# Patient Record
Sex: Female | Born: 1990 | Race: White | Hispanic: No | Marital: Single | Smoking: Never smoker
Health system: Southern US, Community
[De-identification: ages and names within clinical notes are randomized; demographics above are authoritative.]

## PROBLEM LIST (undated history)

## (undated) DIAGNOSIS — D649 Anemia, unspecified: Secondary | ICD-10-CM

## (undated) DIAGNOSIS — B1920 Unspecified viral hepatitis C without hepatic coma: Secondary | ICD-10-CM

## (undated) DIAGNOSIS — K589 Irritable bowel syndrome without diarrhea: Secondary | ICD-10-CM

## (undated) DIAGNOSIS — N390 Urinary tract infection, site not specified: Secondary | ICD-10-CM

## (undated) DIAGNOSIS — R87629 Unspecified abnormal cytological findings in specimens from vagina: Secondary | ICD-10-CM

## (undated) DIAGNOSIS — B9689 Other specified bacterial agents as the cause of diseases classified elsewhere: Secondary | ICD-10-CM

## (undated) DIAGNOSIS — N760 Acute vaginitis: Secondary | ICD-10-CM

## (undated) DIAGNOSIS — IMO0002 Reserved for concepts with insufficient information to code with codable children: Secondary | ICD-10-CM

## (undated) DIAGNOSIS — Z8742 Personal history of other diseases of the female genital tract: Secondary | ICD-10-CM

## (undated) DIAGNOSIS — R87619 Unspecified abnormal cytological findings in specimens from cervix uteri: Secondary | ICD-10-CM

## (undated) HISTORY — DX: Other specified bacterial agents as the cause of diseases classified elsewhere: B96.89

## (undated) HISTORY — DX: Other specified bacterial agents as the cause of diseases classified elsewhere: N76.0

## (undated) HISTORY — PX: NO PAST SURGERIES: SHX2092

## (undated) HISTORY — DX: Unspecified viral hepatitis C without hepatic coma: B19.20

## (undated) HISTORY — DX: Unspecified abnormal cytological findings in specimens from vagina: R87.629

## (undated) HISTORY — DX: Irritable bowel syndrome, unspecified: K58.9

## (undated) HISTORY — DX: Personal history of other diseases of the female genital tract: Z87.42

---

## 2011-11-03 ENCOUNTER — Encounter (HOSPITAL_COMMUNITY): Payer: Self-pay | Admitting: *Deleted

## 2011-11-03 ENCOUNTER — Inpatient Hospital Stay (HOSPITAL_COMMUNITY)
Admission: AD | Admit: 2011-11-03 | Discharge: 2011-11-03 | Disposition: A | Discharge status: Hospice | Payer: Self-pay | Source: Ambulatory Visit | Attending: Obstetrics & Gynecology | Admitting: Obstetrics & Gynecology

## 2011-11-03 DIAGNOSIS — R300 Dysuria: Secondary | ICD-10-CM | POA: Insufficient documentation

## 2011-11-03 DIAGNOSIS — N390 Urinary tract infection, site not specified: Secondary | ICD-10-CM

## 2011-11-03 HISTORY — DX: Urinary tract infection, site not specified: N39.0

## 2011-11-03 HISTORY — DX: Unspecified abnormal cytological findings in specimens from cervix uteri: R87.619

## 2011-11-03 HISTORY — DX: Anemia, unspecified: D64.9

## 2011-11-03 HISTORY — DX: Reserved for concepts with insufficient information to code with codable children: IMO0002

## 2011-11-03 LAB — URINE MICROSCOPIC-ADD ON

## 2011-11-03 LAB — URINALYSIS, ROUTINE W REFLEX MICROSCOPIC
Glucose, UA: NEGATIVE mg/dL
Ketones, ur: NEGATIVE mg/dL
pH: 6.5 (ref 5.0–8.0)

## 2011-11-03 MED ORDER — SULFAMETHOXAZOLE-TRIMETHOPRIM 800-160 MG PO TABS: 1.0000 | ORAL_TABLET | Freq: Two times a day (BID) | ORAL | Status: AC

## 2011-11-03 NOTE — MAU Provider Note (Signed)
History     CSN: [id number]  Arrival date and time: 11/03/11 1503   First Provider Initiated Contact with Patient 11/03/11 1540      No chief complaint on file.  HPI Sheila Merritt is 21 y.o. G0P0 Unknown weeks presenting with sxs of "a bad UTI, it hurts everytime I pee and it throbs".  Has seen blood in her urine.  Has urgency. These sxs began early this am.  LMP 10/28/11.  Sexually active with 1 partner X 4 months.  Has had negative cultures since being with this partner.      Past Medical History  Diagnosis Date  . UTI (lower urinary tract infection)   . Abnormal Pap smear   . Anemia     Past Surgical History  Procedure Date  . No past surgeries     Family History  Problem Relation Age of Onset  . Kidney disease Mother   . Diabetes Mother   . Heart disease Father   . Heart disease Maternal Grandfather   . Heart disease Paternal Grandfather     History  Substance Use Topics  . Smoking status: Never Smoker   . Smokeless tobacco: Not on file  . Alcohol Use: No    Allergies: No Known Allergies  Prescriptions prior to admission  Medication Sig Dispense Refill  . PRESCRIPTION MEDICATION Take 1 tablet by mouth daily. Birth control pills        Review of Systems  Constitutional: Negative for fever and chills.  Gastrointestinal: Negative for nausea, vomiting and abdominal pain.  Genitourinary: Positive for dysuria, urgency, frequency and hematuria.       Suprapubic tenderness  Neurological: Negative for headaches.   Physical Exam   Blood pressure 109/62, pulse 78, temperature 97.5 F (36.4 C), temperature source Oral, resp. rate 16, height 5' (1.524 m), weight 53.978 kg (119 lb), last menstrual period 10/28/2011.  Physical Exam  Constitutional: She is oriented to person, place, and time. She appears well-developed and well-nourished.  HENT:  Head: Normocephalic.  Cardiovascular: Normal rate.   Respiratory: Effort normal.  Genitourinary:       Negative  for flank pain  bilaterally  Neurological: She is alert and oriented to person, place, and time.  Skin: Skin is warm and dry.  Psychiatric: She has a normal mood and affect. Her behavior is normal.   Results for orders placed during the hospital encounter of 11/03/11 (from the past 24 hour(s))  URINALYSIS, ROUTINE W REFLEX MICROSCOPIC     Status: Abnormal   Collection Time   11/03/11  3:10 PM      Component Value Range   Color, Urine STRAW (*) YELLOW   APPearance CLEAR  CLEAR   Specific Gravity, Urine <1.005 (*) 1.005 - 1.030   pH 6.5  5.0 - 8.0   Glucose, UA NEGATIVE  NEGATIVE mg/dL   Hgb urine dipstick MODERATE (*) NEGATIVE   Bilirubin Urine NEGATIVE  NEGATIVE   Ketones, ur NEGATIVE  NEGATIVE mg/dL   Protein, ur NEGATIVE  NEGATIVE mg/dL   Urobilinogen, UA 0.2  0.0 - 1.0 mg/dL   Nitrite NEGATIVE  NEGATIVE   Leukocytes, UA SMALL (*) NEGATIVE  URINE MICROSCOPIC-ADD ON     Status: Normal   Collection Time   11/03/11  3:10 PM      Component Value Range   Squamous Epithelial / LPF RARE  RARE   WBC, UA 0-2  <3 WBC/hpf   RBC / HPF 11-20  <3 RBC/hpf  POCT PREGNANCY, URINE  Status: Normal   Collection Time   11/03/11  3:13 PM      Component Value Range   Preg Test, Ur NEGATIVE  NEGATIVE   MAU Course  Procedures  MDM   Assessment and Plan  A:  UTI  P:  Rx for Bactrim DS bid for 3 days     Increase po fluids     Instructed to urinate after intercourse  Bryttany Tortorelli,EVE M 11/03/2011, 3:40 PM

## 2011-11-03 NOTE — MAU Note (Signed)
Pt started with UTI symptoms early this morning.  She is having throbbing in her bladder, frequency, light bleeding and bladder spasms after voids

## 2011-11-04 NOTE — MAU Provider Note (Signed)
Attestation of Attending Supervision of Advanced Practitioner (CNM/NP): Evaluation and management procedures were performed by the Advanced Practitioner under my supervision and collaboration.  I have reviewed the Advanced Practitioner's note and chart, and I agree with the management and plan.  Jaynie Collins, M.D. 11/04/2011 8:05 AM

## 2014-12-08 ENCOUNTER — Encounter: Payer: Self-pay | Admitting: Adult Health

## 2014-12-08 ENCOUNTER — Other Ambulatory Visit (HOSPITAL_COMMUNITY)
Admission: RE | Admit: 2014-12-08 | Discharge: 2014-12-08 | Disposition: A | Discharge status: Home / Self Care | Payer: Self-pay | Source: Ambulatory Visit | Attending: Adult Health | Admitting: Adult Health

## 2014-12-08 ENCOUNTER — Ambulatory Visit (INDEPENDENT_AMBULATORY_CARE_PROVIDER_SITE_OTHER): Payer: 59 | Admitting: Adult Health

## 2014-12-08 VITALS — BP 90/68 | HR 72 | Ht 61.0 in | Wt 105.0 lb

## 2014-12-08 DIAGNOSIS — Z01411 Encounter for gynecological examination (general) (routine) with abnormal findings: Secondary | ICD-10-CM | POA: Insufficient documentation

## 2014-12-08 DIAGNOSIS — Z3202 Encounter for pregnancy test, result negative: Secondary | ICD-10-CM

## 2014-12-08 DIAGNOSIS — Z1389 Encounter for screening for other disorder: Secondary | ICD-10-CM | POA: Diagnosis not present

## 2014-12-08 DIAGNOSIS — Z30011 Encounter for initial prescription of contraceptive pills: Secondary | ICD-10-CM

## 2014-12-08 DIAGNOSIS — Z8742 Personal history of other diseases of the female genital tract: Secondary | ICD-10-CM

## 2014-12-08 DIAGNOSIS — Z01419 Encounter for gynecological examination (general) (routine) without abnormal findings: Secondary | ICD-10-CM | POA: Diagnosis not present

## 2014-12-08 DIAGNOSIS — Z113 Encounter for screening for infections with a predominantly sexual mode of transmission: Secondary | ICD-10-CM | POA: Insufficient documentation

## 2014-12-08 DIAGNOSIS — R208 Other disturbances of skin sensation: Secondary | ICD-10-CM

## 2014-12-08 HISTORY — DX: Personal history of other diseases of the female genital tract: Z87.42

## 2014-12-08 LAB — POCT URINALYSIS DIPSTICK
Blood, UA: NEGATIVE
GLUCOSE UA: NEGATIVE
Leukocytes, UA: NEGATIVE
NITRITE UA: NEGATIVE
Protein, UA: NEGATIVE

## 2014-12-08 LAB — POCT URINE PREGNANCY: Preg Test, Ur: NEGATIVE

## 2014-12-08 MED ORDER — NORETHINDRONE-ETH ESTRADIOL 0.5-35 MG-MCG PO TABS: 1.0000 | ORAL_TABLET | Freq: Every day | ORAL | Status: DC

## 2014-12-08 NOTE — Patient Instructions (Signed)
Physical in 1 year Take OCs daily at same time Use condoms

## 2014-12-08 NOTE — Progress Notes (Signed)
Patient ID: Sheila Merritt, female   DOB: [birth year], 24 y.o.   MRN: [id number] History of Present Illness: Sheila Merritt is a 24 year old white female, single, new to this practice,in for well woman gyn exam and pap, and needs OCs refilled, has been off 3-4 weeks now.Has history of abnormal pap.Has some burning with urination at times.She has been on Brevicon for 8 years and missed appt, and could not get refills, at prior health center in [place].She works second shift and needs morning appts.   Current Medications, Allergies, Past Medical History, Past Surgical History, Family History and Social History were reviewed in [employer] record.     Review of Systems: Patient denies any headaches, hearing loss, fatigue, blurred vision, shortness of breath, chest pain, abdominal pain, problems with bowel movements, or intercourse. No joint pain or mood swings. See HPI for positives.    Physical Exam:BP 90/68 mmHg  Pulse 72  Ht  (1.549 m)  Wt 105 lb (47.628 kg)  BMI 19.85 kg/m2  LMP 12/03/2014 UPT negative, urine dip stick negative General:  Well developed, well nourished, no acute distress Skin:  Warm and dry, tan has tattoos Neck:  Midline trachea, normal thyroid, good ROM, no lymphadenopathy Lungs; Clear to auscultation bilaterally Breast:  No dominant palpable mass, retraction, or nipple discharge Cardiovascular: Regular rate and rhythm Abdomen:  Soft, non tender, no hepatosplenomegaly Pelvic:  External genitalia is normal in appearance, no lesions.  The vagina is normal in appearance. Urethra has no lesions or masses. The cervix is smooth and tiny, pap with GC/CHl performed.  Uterus is felt to be normal size, shape, and contour.  No adnexal masses or tenderness noted.Bladder is non tender, no masses felt. Extremities/musculoskeletal:  No swelling or varicosities noted, no clubbing or cyanosis Psych:  No mood changes, alert and cooperative,seems  happy   Impression: Well woman gyn exam with pap Contraceptive management History of abnormal pap Burning with urination    Plan: Rx Brevicon disp 1 pack take 1 daily with 11 refills Physical in 1 year Use condoms

## 2014-12-12 LAB — CYTOLOGY - PAP

## 2014-12-13 ENCOUNTER — Telehealth: Payer: Self-pay | Admitting: Adult Health

## 2014-12-13 NOTE — Telephone Encounter (Signed)
Pt aware pap abnormal, needs colpo, will make appt

## 2014-12-22 ENCOUNTER — Encounter: Payer: Self-pay | Admitting: Obstetrics & Gynecology

## 2014-12-22 ENCOUNTER — Other Ambulatory Visit (HOSPITAL_COMMUNITY)
Admission: RE | Admit: 2014-12-22 | Discharge: 2014-12-22 | Disposition: A | Discharge status: Home / Self Care | Payer: 59 | Source: Ambulatory Visit | Attending: Obstetrics & Gynecology | Admitting: Obstetrics & Gynecology

## 2014-12-22 ENCOUNTER — Ambulatory Visit (INDEPENDENT_AMBULATORY_CARE_PROVIDER_SITE_OTHER): Payer: 59 | Admitting: Obstetrics & Gynecology

## 2014-12-22 VITALS — BP 100/70 | HR 72 | Wt 105.0 lb

## 2014-12-22 DIAGNOSIS — R87619 Unspecified abnormal cytological findings in specimens from cervix uteri: Secondary | ICD-10-CM | POA: Diagnosis present

## 2014-12-22 DIAGNOSIS — N871 Moderate cervical dysplasia: Secondary | ICD-10-CM

## 2014-12-22 DIAGNOSIS — IMO0002 Reserved for concepts with insufficient information to code with codable children: Secondary | ICD-10-CM

## 2014-12-22 NOTE — Progress Notes (Signed)
Patient ID: [name], female   DOB: 1991-03-06, 24 y.o.   MRN: [id number] Colposcopy Procedure Note:  Colposcopy Procedure Note  Indications: Pap smear 1 months ago showed: low-grade squamous intraepithelial neoplasia (LGSIL - encompassing HPV,mild dysplasia,CIN I). The prior pap showed ASCUS with POSITIVE high risk HPV.  Prior cervical/vaginal disease: . Prior cervical treatment: no treatment.  Smoker:  No. New sexual partner:  No.  : time frame:    History of abnormal Pap: yes  Procedure Details  The risks and benefits of the procedure and Written informed consent obtained.  Speculum placed in vagina and excellent visualization of cervix achieved, cervix swabbed x 3 with acetic acid solution.  Findings: Cervix: visible lesion(s) at 9 o'clock, acetowhite lesion(s) noted at 9 o'clock, punctation noted at 9 o'clock and mosaicism noted at 9 o'clock; SCJ visualized - lesion at 9 o'clock. Vaginal inspection: normal without visible lesions. Vulvar colposcopy: vulvar colposcopy not performed.  Specimens: cx bx  Complications: none.  Plan: Specimens labelled and sent to Pathology. Follow up 1 week

## 2014-12-22 NOTE — Addendum Note (Signed)
Addended by: Federico Flake A on: 12/22/2014 12:12 PM   Modules accepted: Orders

## 2014-12-23 ENCOUNTER — Telehealth: Payer: Self-pay | Admitting: *Deleted

## 2014-12-23 NOTE — Telephone Encounter (Signed)
Pt called stating she had a procedure yesterday and is having dry discharge. Pt was wondering if this was normal and how long it would last. I advised it sounds normal and the discharge can last a few days. Pt voiced understanding. JSY

## 2014-12-29 ENCOUNTER — Telehealth: Payer: Self-pay | Admitting: Obstetrics & Gynecology

## 2014-12-29 ENCOUNTER — Ambulatory Visit: Payer: 59 | Admitting: Obstetrics & Gynecology

## 2014-12-29 NOTE — Telephone Encounter (Signed)
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

## 2014-12-29 NOTE — Telephone Encounter (Signed)
Pt informed message sent to Dr.Eure and will notify pt as soon as Dr.Eure responds to message. Pt verbalized understanding.

## 2014-12-29 NOTE — Telephone Encounter (Signed)
Pt was unable to make her appt today, requesting results by phone.

## 2014-12-29 NOTE — Telephone Encounter (Signed)
Pt informed of result from cervical biopsy from 12/22/2014 HSIL, Dr. Despina Hidden recommendation to make an appt to discuss laser of cervix/surgical management. All questions answered in regards to abnormal results and appt made for 01/06/2015 at 10 am with Dr. Despina Hidden.

## 2015-01-06 ENCOUNTER — Encounter: Payer: Self-pay | Admitting: Obstetrics & Gynecology

## 2015-01-06 ENCOUNTER — Ambulatory Visit (INDEPENDENT_AMBULATORY_CARE_PROVIDER_SITE_OTHER): Payer: 59 | Admitting: Obstetrics & Gynecology

## 2015-01-06 ENCOUNTER — Ambulatory Visit: Payer: Self-pay | Admitting: Obstetrics & Gynecology

## 2015-01-06 VITALS — BP 120/62 | HR 60 | Ht 61.0 in | Wt 107.0 lb

## 2015-01-06 DIAGNOSIS — R87613 High grade squamous intraepithelial lesion on cytologic smear of cervix (HGSIL): Secondary | ICD-10-CM

## 2015-01-20 NOTE — Patient Instructions (Signed)
Larinda Herter  01/20/2015     @   Your procedure is scheduled on  01/27/2015   Report to [facility] at  1100  A.M.  Call this number if you have problems the morning of surgery:  [phone]   Remember:  Do not eat food or drink liquids after midnight.  Take these medicines the morning of surgery with A SIP OF WATER  none   Do not wear jewelry, make-up or nail polish.  Do not wear lotions, powders, or perfumes.  You may wear deodorant.  Do not shave 48 hours prior to surgery.  Men may shave face and neck.  Do not bring valuables to the hospital.  [facility] is not responsible for any belongings or valuables.  Contacts, dentures or bridgework may not be worn into surgery.  Leave your suitcase in the car.  After surgery it may be brought to your room.  For patients admitted to the hospital, discharge time will be determined by your treatment team.  Patients discharged the day of surgery will not be allowed to drive home.   Name and phone number of your driver:   family Special instructions:  none  Please read over the following fact sheets that you were given. Pain Booklet, Coughing and Deep Breathing, MRSA Information, Surgical Site Infection Prevention, Anesthesia Post-op Instructions and Care and Recovery After Surgery      Conization of the Cervix Cervical conization is the cutting (excision) of a cone-shaped portion of the cervix. The procedure is performed through the vagina in either your health care provider's office or an operating room. This procedure is usually done when there is abnormal bleeding from the cervix. It can also be done to evaluate an abnormal Pap test or if an abnormality is seen on the cervix during an exam. The tissue is then examined to see if there are precancerous cells or cancer present.  Conization of the cervix is not done during a menstrual period or pregnancy.  LET [facility] CARE PROVIDER KNOW ABOUT:  Any  allergies you have.   All medicines you are taking, including vitamins, herbs, eye drops, creams, and over-the-counter medicines.   Previous problems you or members of your family have had with the use of anesthetics.   Any blood disorders you have.   Previous surgeries you have had.   Medical conditions you have.   Your smoking habits.   The possibility of being pregnant.  RISKS AND COMPLICATIONS  Generally, conization of the cervix is a safe procedure. However, as with any procedure, complications can occur. Possible complications include:  Heavy bleeding several days or weeks after the procedure. Light bleeding or spotting after the procedure is normal.  Infection (rare).  Damage to the cervix or surrounding organs (uncommon).   Problems with the anesthesia.   Increased risk of preterm labor in future pregnancies. BEFORE THE PROCEDURE  Do not eat or drink anything for 6-8 hours before the procedure.   Do not take aspirin or blood thinners for at least a week before the procedure or as directed by your health care provider.   Arrange for someone to take you home after the procedure.  PROCEDURE There are three different methods to perform conization of the cervix. These include:   The cold knife method. In this method a small cone-shaped sample of tissue is cut out with a knife (scalpel) from the cervical canal and the transformation zone (where the normal cells  end and the abnormal cells begin).   The LEEP method. In this method a small cone-shaped sample of tissue is cut out with a thin wire that can burn (cauterize) the cervical tissue with an electrical current.   Laser treatment. In this method a small cone-shaped sample of tissue is cut out and then cauterized with a laser beam to prevent bleeding.  The procedure will be performed as follows:   Depending on the method, you will either be given a medicine to make you sleep (general anesthetic) or a  numbing medicine (local anesthetic). A medicine that numbs the cervix (cervical block) may be given.   A lubricated device called a speculum will be inserted into the vagina to spread open the walls of the vagina. This will help your health care provider see the inside of the vagina and cervix better.   The tissue from the cervix will be removed and examined.   The results of the procedure will help your health care provider decide if further treatment is necessary. They will also help your health care provider decide on the best treatment if your results are abnormal. AFTER THE PROCEDURE  If you had a general anesthetic, you may be groggy for 2-3 hours after the procedure.   If you had a local anesthetic, you will rest at the clinic or hospital until you are stable and feel ready to go home.   Recovery may take up to 3 weeks.   You may have some cramping for about 1 week.   You may have bloody discharge or light bleeding for 1-2 weeks.   You may have black discharge coming from the vagina. This is from the paste used on the cervix to prevent bleeding. This is normal discharge.  Document Released: 01/23/2005 Document Revised: 04/20/2013 Document Reviewed: 10/09/2012 [facility] Patient Information 2015 [place], [place]. This information is not intended to replace advice given to you by your health care provider. Make sure you discuss any questions you have with your health care provider. PATIENT INSTRUCTIONS POST-ANESTHESIA  IMMEDIATELY FOLLOWING SURGERY:  Do not drive or operate machinery for the first twenty four hours after surgery.  Do not make any important decisions for twenty four hours after surgery or while taking narcotic pain medications or sedatives.  If you develop intractable nausea and vomiting or a severe headache please notify your doctor immediately.  FOLLOW-UP:  Please make an appointment with your surgeon as instructed. You do not need to follow up with anesthesia  unless specifically instructed to do so.  WOUND CARE INSTRUCTIONS (if applicable):  Keep a dry clean dressing on the anesthesia/puncture wound site if there is drainage.  Once the wound has quit draining you may leave it open to air.  Generally you should leave the bandage intact for twenty four hours unless there is drainage.  If the epidural site drains for more than 36-48 hours please call the anesthesia department.  QUESTIONS?:  Please feel free to call your physician or the hospital operator if you have any questions, and they will be happy to assist you.

## 2015-01-23 ENCOUNTER — Encounter (HOSPITAL_COMMUNITY)
Admission: RE | Admit: 2015-01-23 | Discharge: 2015-01-23 | Disposition: A | Discharge status: Home / Self Care | Payer: Managed Care, Other (non HMO) | Source: Ambulatory Visit | Attending: Obstetrics & Gynecology | Admitting: Obstetrics & Gynecology

## 2015-01-25 ENCOUNTER — Encounter (HOSPITAL_COMMUNITY)
Admission: RE | Admit: 2015-01-25 | Discharge: 2015-01-25 | Disposition: A | Discharge status: Home / Self Care | Payer: 59 | Source: Ambulatory Visit | Attending: Obstetrics & Gynecology | Admitting: Obstetrics & Gynecology

## 2015-01-25 ENCOUNTER — Encounter (HOSPITAL_COMMUNITY): Payer: Self-pay

## 2015-01-25 DIAGNOSIS — R87613 High grade squamous intraepithelial lesion on cytologic smear of cervix (HGSIL): Secondary | ICD-10-CM | POA: Diagnosis present

## 2015-01-25 DIAGNOSIS — Z01812 Encounter for preprocedural laboratory examination: Secondary | ICD-10-CM | POA: Diagnosis not present

## 2015-01-25 DIAGNOSIS — D067 Carcinoma in situ of other parts of cervix: Secondary | ICD-10-CM | POA: Diagnosis not present

## 2015-01-25 LAB — URINALYSIS, ROUTINE W REFLEX MICROSCOPIC
Bilirubin Urine: NEGATIVE
Glucose, UA: NEGATIVE mg/dL
Ketones, ur: NEGATIVE mg/dL
LEUKOCYTES UA: NEGATIVE
Nitrite: NEGATIVE
Protein, ur: NEGATIVE mg/dL
UROBILINOGEN UA: 0.2 mg/dL (ref 0.0–1.0)
pH: 5.5 (ref 5.0–8.0)

## 2015-01-25 LAB — BASIC METABOLIC PANEL
Anion gap: 7 (ref 5–15)
BUN: 12 mg/dL (ref 6–20)
CHLORIDE: 103 mmol/L (ref 101–111)
CO2: 26 mmol/L (ref 22–32)
Calcium: 8.6 mg/dL — ABNORMAL LOW (ref 8.9–10.3)
Creatinine, Ser: 0.82 mg/dL (ref 0.44–1.00)
GFR calc Af Amer: >60 mL/min (ref >60)
GFR calc non Af Amer: >60 mL/min (ref >60)
GLUCOSE: 135 mg/dL — AB (ref 65–99)
POTASSIUM: 3.7 mmol/L (ref 3.5–5.1)
Sodium: 136 mmol/L (ref 135–145)

## 2015-01-25 LAB — CBC
HEMATOCRIT: 36.8 % (ref 36.0–46.0)
HEMOGLOBIN: 12.4 g/dL (ref 12.0–15.0)
MCH: 30.8 pg (ref 26.0–34.0)
MCHC: 33.7 g/dL (ref 30.0–36.0)
MCV: 91.5 fL (ref 78.0–100.0)
Platelets: 220 10*3/uL (ref 150–400)
RBC: 4.02 MIL/uL (ref 3.87–5.11)
RDW: 13.3 % (ref 11.5–15.5)
WBC: 4.2 10*3/uL (ref 4.0–10.5)

## 2015-01-25 LAB — URINE MICROSCOPIC-ADD ON

## 2015-01-25 LAB — HCG, SERUM, QUALITATIVE: Preg, Serum: NEGATIVE

## 2015-01-27 ENCOUNTER — Ambulatory Visit (HOSPITAL_COMMUNITY): Payer: 59 | Admitting: Anesthesiology

## 2015-01-27 ENCOUNTER — Encounter (HOSPITAL_COMMUNITY)
Admission: RE | Disposition: A | Discharge status: Home / Self Care | Payer: Self-pay | Source: Ambulatory Visit | Attending: Obstetrics & Gynecology

## 2015-01-27 ENCOUNTER — Encounter (HOSPITAL_COMMUNITY): Payer: Self-pay | Admitting: Anesthesiology

## 2015-01-27 ENCOUNTER — Ambulatory Visit (HOSPITAL_COMMUNITY)
Admission: RE | Admit: 2015-01-27 | Discharge: 2015-01-27 | Disposition: A | Discharge status: Home / Self Care | Payer: 59 | Source: Ambulatory Visit | Attending: Obstetrics & Gynecology | Admitting: Obstetrics & Gynecology

## 2015-01-27 DIAGNOSIS — D067 Carcinoma in situ of other parts of cervix: Secondary | ICD-10-CM | POA: Diagnosis not present

## 2015-01-27 DIAGNOSIS — R87613 High grade squamous intraepithelial lesion on cytologic smear of cervix (HGSIL): Secondary | ICD-10-CM | POA: Diagnosis not present

## 2015-01-27 DIAGNOSIS — Z01812 Encounter for preprocedural laboratory examination: Secondary | ICD-10-CM | POA: Insufficient documentation

## 2015-01-27 DIAGNOSIS — N871 Moderate cervical dysplasia: Secondary | ICD-10-CM | POA: Diagnosis not present

## 2015-01-27 HISTORY — PX: CERVICAL CONIZATION W/BX: SHX1330

## 2015-01-27 HISTORY — PX: HOLMIUM LASER APPLICATION: SHX5852

## 2015-01-27 SURGERY — CONE BIOPSY, CERVIX
Anesthesia: General | Site: Cervix

## 2015-01-27 MED ORDER — LACTATED RINGERS IV SOLN
INTRAVENOUS | Status: DC
  Administered 2015-01-27: 500 mL via INTRAVENOUS
  Administered 2015-01-27: 12:00:00 via INTRAVENOUS

## 2015-01-27 MED ORDER — KETOROLAC TROMETHAMINE 30 MG/ML IJ SOLN
INTRAMUSCULAR | Status: AC
  Filled 2015-01-27: qty 1

## 2015-01-27 MED ORDER — FENTANYL CITRATE (PF) 100 MCG/2ML IJ SOLN
INTRAMUSCULAR | Status: AC
  Filled 2015-01-27: qty 4

## 2015-01-27 MED ORDER — PROPOFOL 10 MG/ML IV BOLUS
INTRAVENOUS | Status: DC | PRN
  Administered 2015-01-27: 50 mg via INTRAVENOUS
  Administered 2015-01-27: 100 mg via INTRAVENOUS

## 2015-01-27 MED ORDER — ONDANSETRON HCL 4 MG/2ML IJ SOLN: 4.0000 mg | Freq: Once | INTRAMUSCULAR | Status: DC | PRN

## 2015-01-27 MED ORDER — MIDAZOLAM HCL 2 MG/2ML IJ SOLN
1.0000 mg | INTRAMUSCULAR | Status: DC | PRN
  Administered 2015-01-27: 2 mg via INTRAVENOUS

## 2015-01-27 MED ORDER — FERRIC SUBSULFATE SOLN
Status: DC | PRN
  Administered 2015-01-27: 1

## 2015-01-27 MED ORDER — FENTANYL CITRATE (PF) 100 MCG/2ML IJ SOLN
INTRAMUSCULAR | Status: DC | PRN
  Administered 2015-01-27: 25 ug via INTRAVENOUS
  Administered 2015-01-27: 50 ug via INTRAVENOUS
  Administered 2015-01-27: 25 ug via INTRAVENOUS

## 2015-01-27 MED ORDER — FERRIC SUBSULFATE 259 MG/GM EX SOLN
CUTANEOUS | Status: AC
  Filled 2015-01-27: qty 8

## 2015-01-27 MED ORDER — ACETIC ACID 5 % SOLN
Status: DC | PRN
  Administered 2015-01-27: 1 via TOPICAL

## 2015-01-27 MED ORDER — PROPOFOL 10 MG/ML IV BOLUS
INTRAVENOUS | Status: AC
  Filled 2015-01-27: qty 20

## 2015-01-27 MED ORDER — MIDAZOLAM HCL 2 MG/2ML IJ SOLN
INTRAMUSCULAR | Status: AC
  Filled 2015-01-27: qty 2

## 2015-01-27 MED ORDER — KETOROLAC TROMETHAMINE 30 MG/ML IJ SOLN
INTRAMUSCULAR | Status: DC | PRN
  Administered 2015-01-27: 30 mg via INTRAVENOUS

## 2015-01-27 MED ORDER — KETOROLAC TROMETHAMINE 10 MG PO TABS: 10.0000 mg | ORAL_TABLET | Freq: Three times a day (TID) | ORAL | Status: DC | PRN

## 2015-01-27 MED ORDER — ONDANSETRON HCL 8 MG PO TABS: 8.0000 mg | ORAL_TABLET | Freq: Three times a day (TID) | ORAL | Status: DC | PRN

## 2015-01-27 MED ORDER — LIDOCAINE HCL (CARDIAC) 20 MG/ML IV SOLN
INTRAVENOUS | Status: DC | PRN
  Administered 2015-01-27: 30 mg via INTRAVENOUS

## 2015-01-27 MED ORDER — WATER FOR IRRIGATION, STERILE IR SOLN
Status: DC | PRN
  Administered 2015-01-27: 1000 mL

## 2015-01-27 MED ORDER — FENTANYL CITRATE (PF) 100 MCG/2ML IJ SOLN
25.0000 ug | INTRAMUSCULAR | Status: DC | PRN
  Administered 2015-01-27 (×2): 50 ug via INTRAVENOUS

## 2015-01-27 MED ORDER — FENTANYL CITRATE (PF) 100 MCG/2ML IJ SOLN
INTRAMUSCULAR | Status: AC
  Filled 2015-01-27: qty 2

## 2015-01-27 MED ORDER — HYDROCODONE-ACETAMINOPHEN 5-325 MG PO TABS: 1.0000 | ORAL_TABLET | Freq: Four times a day (QID) | ORAL | Status: DC | PRN

## 2015-01-27 MED ORDER — LIDOCAINE HCL (PF) 1 % IJ SOLN
INTRAMUSCULAR | Status: AC
  Filled 2015-01-27: qty 5

## 2015-01-27 SURGICAL SUPPLY — 28 items
APPLICATOR COTTON TIP 6IN STRL (MISCELLANEOUS) ×3 IMPLANT
CLOTH BEACON ORANGE TIMEOUT ST (SAFETY) ×3 IMPLANT
COAGULATOR SUCT 6 FR SWTCH (ELECTROSURGICAL) ×1
COAGULATOR SUCT SWTCH 10FR 6 (ELECTROSURGICAL) ×2 IMPLANT
COVER LIGHT HANDLE STERIS (MISCELLANEOUS) ×6 IMPLANT
ELECT REM PT RETURN 9FT ADLT (ELECTROSURGICAL) ×3
ELECTRODE REM PT RTRN 9FT ADLT (ELECTROSURGICAL) ×1 IMPLANT
FORMALIN 10 PREFIL 120ML (MISCELLANEOUS) ×3 IMPLANT
GAUZE SPONGE 4X4 16PLY XRAY LF (GAUZE/BANDAGES/DRESSINGS) ×3 IMPLANT
GLOVE BIOGEL PI IND STRL 7.5 (GLOVE) ×1 IMPLANT
GLOVE BIOGEL PI IND STRL 8 (GLOVE) ×1 IMPLANT
GLOVE BIOGEL PI INDICATOR 7.5 (GLOVE) ×2
GLOVE BIOGEL PI INDICATOR 8 (GLOVE) ×2
GLOVE ECLIPSE 8.0 STRL XLNG CF (GLOVE) ×3 IMPLANT
GLOVE EXAM NITRILE MD LF STRL (GLOVE) ×3 IMPLANT
GOWN STRL REUS W/TWL LRG LVL3 (GOWN DISPOSABLE) ×3 IMPLANT
GOWN STRL REUS W/TWL XL LVL3 (GOWN DISPOSABLE) ×3 IMPLANT
KIT ROOM TURNOVER APOR (KITS) ×3 IMPLANT
LASER FIBER DISP 1000U (UROLOGICAL SUPPLIES) ×3 IMPLANT
MANIFOLD NEPTUNE II (INSTRUMENTS) ×3 IMPLANT
NS IRRIG 1000ML POUR BTL (IV SOLUTION) ×3 IMPLANT
PACK BASIC III (CUSTOM PROCEDURE TRAY) ×2
PACK SRG BSC III STRL LF ECLPS (CUSTOM PROCEDURE TRAY) ×1 IMPLANT
PAD ARMBOARD 7.5X6 YLW CONV (MISCELLANEOUS) ×3 IMPLANT
PREFILTER SMOKE EVAC (FILTER) ×3 IMPLANT
TOWEL OR 17X26 4PK STRL BLUE (TOWEL DISPOSABLE) ×3 IMPLANT
TUBING SMOKE EVAC CO2 (TUBING) ×3 IMPLANT
WATER STERILE IRR 1000ML POUR (IV SOLUTION) ×3 IMPLANT

## 2015-01-27 NOTE — H&P (Signed)
Preoperative History and Physical  Sheila Merritt is a 24 y.o. G0P0 with Patient's last menstrual period was 01/27/2015. admitted for a laser conization of the cervix due to HSIL on colposcopically directed biopsy.   PMH:    Past Medical History  Diagnosis Date  . UTI (lower urinary tract infection)   . Abnormal Pap smear   . Anemia   . Vaginal Pap smear, abnormal   . BV (bacterial vaginosis)   . History of abnormal cervical Pap smear 12/08/2014    PSH:     Past Surgical History  Procedure Laterality Date  . No past surgeries      POb/GynH:      OB History    Gravida Para Term Preterm AB TAB SAB Ectopic Multiple Living   0               SH:   Social History  Substance Use Topics  . Smoking status: Never Smoker   . Smokeless tobacco: Never Used  . Alcohol Use: Yes     Comment: occ    FH:    Family History  Problem Relation Age of Onset  . Hypertension Mother   . Diabetes Mother     borderline  . Heart disease Father   . Heart attack Father     had heart surgery  . Heart disease Maternal Grandfather   . Heart attack Maternal Grandfather   . Heart disease Paternal Grandfather   . Heart attack Paternal Grandfather   . Diabetes Paternal Grandmother   . Diabetes Brother     borderline  . Other Brother     killed in MVA     Allergies: No Known Allergies  Medications:       Current facility-administered medications:  .  lactated ringers infusion, , Intravenous, Continuous, Benita Stabile, MD .  midazolam (VERSED) injection 1-2 mg, 1-2 mg, Intravenous, Q5 min PRN, Benita Stabile, MD  Review of Systems:   Review of Systems  Constitutional: Negative for fever, chills, weight loss, malaise/fatigue and diaphoresis.  HENT: Negative for hearing loss, ear pain, nosebleeds, congestion, sore throat, neck pain, tinnitus and ear discharge.   Eyes: Negative for blurred vision, double vision, photophobia, pain, discharge and redness.  Respiratory:  Negative for cough, hemoptysis, sputum production, shortness of breath, wheezing and stridor.   Cardiovascular: Negative for chest pain, palpitations, orthopnea, claudication, leg swelling and PND.  Gastrointestinal: Positive for abdominal pain. Negative for heartburn, nausea, vomiting, diarrhea, constipation, blood in stool and melena.  Genitourinary: Negative for dysuria, urgency, frequency, hematuria and flank pain.  Musculoskeletal: Negative for myalgias, back pain, joint pain and falls.  Skin: Negative for itching and rash.  Neurological: Negative for dizziness, tingling, tremors, sensory change, speech change, focal weakness, seizures, loss of consciousness, weakness and headaches.  Endo/Heme/Allergies: Negative for environmental allergies and polydipsia. Does not bruise/bleed easily.  Psychiatric/Behavioral: Negative for depression, suicidal ideas, hallucinations, memory loss and substance abuse. The patient is not nervous/anxious and does not have insomnia.      PHYSICAL EXAM:  Blood pressure 119/79, pulse 69, temperature 98.2 F (36.8 C), temperature source Oral, resp. rate 20, height  (1.549 m), weight 106 lb (48.081 kg), last menstrual period 01/27/2015, SpO2 100 %.    Vitals reviewed. Constitutional: She is oriented to person, place, and time. She appears well-developed and well-nourished.  HENT:  Head: Normocephalic and atraumatic.  Right Ear: External ear normal.  Left Ear: External ear normal.  Nose: Nose normal.  Mouth/Throat:  Oropharynx is clear and moist.  Eyes: Conjunctivae and EOM are normal. Pupils are equal, round, and reactive to light. Right eye exhibits no discharge. Left eye exhibits no discharge. No scleral icterus.  Neck: Normal range of motion. Neck supple. No tracheal deviation present. No thyromegaly present.  Cardiovascular: Normal rate, regular rhythm, normal heart sounds and intact distal pulses.  Exam reveals no gallop and no friction rub.   No  murmur heard. Respiratory: Effort normal and breath sounds normal. No respiratory distress. She has no wheezes. She has no rales. She exhibits no tenderness.  GI: Soft. Bowel sounds are normal. She exhibits no distension and no mass. There is tenderness. There is no rebound and no guarding.  Genitourinary:       Vulva is normal without lesions Vagina is pink moist without discharge Cervix normal in appearance and pap is normal Uterus is normal size, contour, position, consistency, mobility, non-tender Adnexa is negative with normal sized ovaries by sonogram  Musculoskeletal: Normal range of motion. She exhibits no edema and no tenderness.  Neurological: She is alert and oriented to person, place, and time. She has normal reflexes. She displays normal reflexes. No cranial nerve deficit. She exhibits normal muscle tone. Coordination normal.  Skin: Skin is warm and dry. No rash noted. No erythema. No pallor.  Psychiatric: She has a normal mood and affect. Her behavior is normal. Judgment and thought content normal.    Labs: Results for orders placed or performed during the hospital encounter of 01/25/15 (from the past 336 hour(s))  Basic metabolic panel   Collection Time: 01/25/15 12:45 PM  Result Value Ref Range   Sodium 136 135 - 145 mmol/L   Potassium 3.7 3.5 - 5.1 mmol/L   Chloride 103 101 - 111 mmol/L   CO2 26 22 - 32 mmol/L   Glucose, Bld 135 (H) 65 - 99 mg/dL   BUN 12 6 - 20 mg/dL   Creatinine, Ser 1.61 0.44 - 1.00 mg/dL   Calcium 8.6 (L) 8.9 - 10.3 mg/dL   GFR calc non Af Amer >60 >60 mL/min   GFR calc Af Amer >60 >60 mL/min   Anion gap 7 5 - 15  CBC   Collection Time: 01/25/15 12:45 PM  Result Value Ref Range   WBC 4.2 4.0 - 10.5 K/uL   RBC 4.02 3.87 - 5.11 MIL/uL   Hemoglobin 12.4 12.0 - 15.0 g/dL   HCT 09.6 04.5 - 40.9 %   MCV 91.5 78.0 - 100.0 fL   MCH 30.8 26.0 - 34.0 pg   MCHC 33.7 30.0 - 36.0 g/dL   RDW 81.1 91.4 - 78.2 %   Platelets 220 150 - 400 K/uL  hCG,  serum, qualitative   Collection Time: 01/25/15 12:45 PM  Result Value Ref Range   Preg, Serum NEGATIVE NEGATIVE  Urinalysis, Routine w reflex microscopic (not at [facility])   Collection Time: 01/25/15 12:55 PM  Result Value Ref Range   Color, Urine YELLOW YELLOW   APPearance CLEAR CLEAR   Specific Gravity, Urine >1.030 (H) 1.005 - 1.030   pH 5.5 5.0 - 8.0   Glucose, UA NEGATIVE NEGATIVE mg/dL   Hgb urine dipstick LARGE (A) NEGATIVE   Bilirubin Urine NEGATIVE NEGATIVE   Ketones, ur NEGATIVE NEGATIVE mg/dL   Protein, ur NEGATIVE NEGATIVE mg/dL   Urobilinogen, UA 0.2 0.0 - 1.0 mg/dL   Nitrite NEGATIVE NEGATIVE   Leukocytes, UA NEGATIVE NEGATIVE  Urine microscopic-add on   Collection Time: 01/25/15 12:55 PM  Result Value Ref Range   Squamous Epithelial / LPF MANY (A) RARE   WBC, UA 3-6 <3 WBC/hpf   RBC / HPF 3-6 <3 RBC/hpf   Bacteria, UA MANY (A) RARE    EKG: No orders found for this or any previous visit.  Imaging Studies: No results found.    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 History of abnormal cervical Pap smear 12/08/2014    Plan: Laser conization of the cervix  EURE,LUTHER H 01/27/2015 12:18 PM

## 2015-01-27 NOTE — Transfer of Care (Signed)
Immediate Anesthesia Transfer of Care Note  Patient: Sheila Merritt  Procedure(s) Performed: Procedure(s): LASER CONIZATION OF CERVIX WITH BIOPSY (N/A) HOLMIUM LASER APPLICATION (N/A)  Patient Location: PACU  Anesthesia Type:General  Level of Consciousness: awake, alert  and oriented  Airway & Oxygen Therapy: Patient Spontanous Breathing and Patient connected to face mask oxygen  Post-op Assessment: Report given to RN  Post vital signs: Reviewed and stable  Last Vitals:  Filed Vitals:   01/27/15 1230  BP: 122/81  Pulse:   Temp:   Resp: 25    Complications: No apparent anesthesia complications

## 2015-01-27 NOTE — Discharge Instructions (Signed)
Conization of the Cervix Cervical conization is the cutting (excision) of a cone-shaped portion of the cervix. The procedure is performed through the vagina in either your health care provider's office or an operating room. This procedure is usually done when there is abnormal bleeding from the cervix. It can also be done to evaluate an abnormal Pap test or if an abnormality is seen on the cervix during an exam. The tissue is then examined to see if there are precancerous cells or cancer present.  Conization of the cervix is not done during a menstrual period or pregnancy.  LET YOUR HEALTH CARE PROVIDER KNOW ABOUT:  Any allergies you have.   All medicines you are taking, including vitamins, herbs, eye drops, creams, and over-the-counter medicines.   Previous problems you or members of your family have had with the use of anesthetics.   Any blood disorders you have.   Previous surgeries you have had.   Medical conditions you have.   Your smoking habits.   The possibility of being pregnant.  RISKS AND COMPLICATIONS  Generally, conization of the cervix is a safe procedure. However, as with any procedure, complications can occur. Possible complications include:  Heavy bleeding several days or weeks after the procedure. Light bleeding or spotting after the procedure is normal.  Infection (rare).  Damage to the cervix or surrounding organs (uncommon).   Problems with the anesthesia.   Increased risk of preterm labor in future pregnancies. BEFORE THE PROCEDURE  Do not eat or drink anything for 6-8 hours before the procedure.   Do not take aspirin or blood thinners for at least a week before the procedure or as directed by your health care provider.   Arrange for someone to take you home after the procedure.  PROCEDURE There are three different methods to perform conization of the cervix. These include:   The cold knife method. In this method a small cone-shaped sample  of tissue is cut out with a knife (scalpel) from the cervical canal and the transformation zone (where the normal cells end and the abnormal cells begin).   The LEEP method. In this method a small cone-shaped sample of tissue is cut out with a thin wire that can burn (cauterize) the cervical tissue with an electrical current.   Laser treatment. In this method a small cone-shaped sample of tissue is cut out and then cauterized with a laser beam to prevent bleeding.  The procedure will be performed as follows:   Depending on the method, you will either be given a medicine to make you sleep (general anesthetic) or a numbing medicine (local anesthetic). A medicine that numbs the cervix (cervical block) may be given.   A lubricated device called a speculum will be inserted into the vagina to spread open the walls of the vagina. This will help your health care provider see the inside of the vagina and cervix better.   The tissue from the cervix will be removed and examined.   The results of the procedure will help your health care provider decide if further treatment is necessary. They will also help your health care provider decide on the best treatment if your results are abnormal. AFTER THE PROCEDURE  If you had a general anesthetic, you may be groggy for 2-3 hours after the procedure.   If you had a local anesthetic, you will rest at the clinic or hospital until you are stable and feel ready to go home.   Recovery may take up   to 3 weeks.   You may have some cramping for about 1 week.   You may have bloody discharge or light bleeding for 1-2 weeks.   You may have black discharge coming from the vagina. This is from the paste used on the cervix to prevent bleeding. This is normal discharge.  Document Released: 01/23/2005 Document Revised: 04/20/2013 Document Reviewed: 10/09/2012 [employer] Patient Information 2015 [employer]. This information is not intended to replace advice  given to you by your health care provider. Make sure you discuss any questions you have with your health care provider.  

## 2015-01-27 NOTE — Anesthesia Preprocedure Evaluation (Addendum)
Anesthesia Evaluation  Patient identified by MRN, date of birth, ID band Patient awake    Reviewed: Allergy & Precautions, NPO status , Patient's Chart, lab work & pertinent test results  History of Anesthesia Complications Negative for: history of anesthetic complications  Airway Mallampati: I  TM Distance: >3 FB Neck ROM: Full    Dental no notable dental hx. (+) Teeth Intact, Dental Advisory Given   Pulmonary neg pulmonary ROS,    Pulmonary exam normal        Cardiovascular negative cardio ROS Normal cardiovascular exam     Neuro/Psych negative neurological ROS     GI/Hepatic negative GI ROS, Neg liver ROS,   Endo/Other  negative endocrine ROS  Renal/GU   negative genitourinary   Musculoskeletal negative musculoskeletal ROS (+)   Abdominal Normal abdominal exam  (+)   Peds negative pediatric ROS (+)  Hematology negative hematology ROS (+)   Anesthesia Other Findings   Reproductive/Obstetrics negative OB ROS                            Anesthesia Physical Anesthesia Plan  ASA: I  Anesthesia Plan: General   Post-op Pain Management:    Induction: Intravenous  Airway Management Planned: LMA  Additional Equipment:   Intra-op Plan:   Post-operative Plan: Extubation in OR  Informed Consent: I have reviewed the patients History and Physical, chart, labs and discussed the procedure including the risks, benefits and alternatives for the proposed anesthesia with the patient or authorized representative who has indicated his/her understanding and acceptance.   Dental advisory given  Plan Discussed with: CRNA  Anesthesia Plan Comments:         Anesthesia Quick Evaluation

## 2015-01-27 NOTE — Anesthesia Procedure Notes (Addendum)
Procedure Name: LMA Insertion Date/Time: 01/27/2015 12:42 PM Performed by: Glynn Octave E Pre-anesthesia Checklist: Patient identified, Patient being monitored, Emergency Drugs available, Timeout performed and Suction available Patient Re-evaluated:Patient Re-evaluated prior to inductionOxygen Delivery Method: Circle System Utilized Preoxygenation: Pre-oxygenation with 100% oxygen Intubation Type: IV induction Ventilation: Mask ventilation without difficulty LMA: LMA inserted LMA Size: 4.0 and 3.0 Number of attempts: 1 Placement Confirmation: positive ETCO2 and breath sounds checked- equal and bilateral

## 2015-01-27 NOTE — Addendum Note (Signed)
Addendum  created 01/27/15 1359 by Benita Stabile, MD   Modules edited: Anesthesia Attestations

## 2015-01-27 NOTE — Anesthesia Postprocedure Evaluation (Signed)
  Anesthesia Post-op Note  Patient: Sheila Merritt  Procedure(s) Performed: Procedure(s): LASER CONIZATION OF CERVIX WITH BIOPSY (N/A) HOLMIUM LASER APPLICATION (N/A)  Patient Location: PACU  Anesthesia Type:General  Level of Consciousness: awake, alert  and oriented  Airway and Oxygen Therapy: Patient Spontanous Breathing  Post-op Pain: none  Post-op Assessment: Post-op Vital signs reviewed, Patient's Cardiovascular Status Stable, Respiratory Function Stable, Patent Airway and No signs of Nausea or vomiting              Post-op Vital Signs: Reviewed and stable  Last Vitals:  Filed Vitals:   01/27/15 1333  BP: 116/78  Pulse: 86  Temp: 36.7 C  Resp: 14    Complications: No apparent anesthesia complications

## 2015-01-27 NOTE — Op Note (Signed)
Preoperative diagnosis:  1.  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  Large lesion  Postoperative Diagnosis:  Same as above  Procedure:  Cervical conization using laser,  ablation of cervical bed using laser  Surgeon:  Rockne Coons MD  Anesthesia:  Laryngeal mask airway  Findings:  Patient had her last Pap returned as atypical squamous cells cannot rule out high-grade dysplasia lesion + High Risk HPV.  I performed a colposcopy in the office and found a very large cervical lesion.  A biopsy returned revealing high-grade dysplasia with involvement of the underlying endocervical glands.  As a result we decided to proceed with a conization of the cervix for both therapeutic and diagnostic reasons.  Also the lesion was quite large and extensive.  Today at the time of surgery a repeat colposcopy was performed using 3% acetic acid and the lesion was once again confirmed.  There  were no new findings today.  Description of operation:  Patient was taken to the operating room and placed in the supine position where she underwent laryngeal mask airway anesthesia.  She was then placed in the high lithotomy position using candy cane stirrups.  She was then draped out for a laser procedure.  The microscope was used and 3% acetic acid was placed on the cervix.  The holmium laser was then employed at a power of 2.5 and rates between 8 and 15.  I achieved a couple millimeter margin around lesions both at 12:00 and 6:00 the laser was used to perform a conization.  The specimen was removed and sent to pathology for evaluation.  As is always the case with laser I did achieve at an appropriate margin around the disease with shrinkage of the tissue during the procedure it may appear to be a positive lateral margin.  However the surgical margin is indeed clear.  I then used the laser to ablate the conization bed to a depth of 5-7 mm  laterally coning down to 9 mm centrally and began getting good surgical margin.  Additional hemostasis was achieved using Monsel solution.  In the conization bed was completely hemostatic.  Blood loss for the procedure was none.  The patient received ancef and toradol preoperatively.  The patient was awakened from anesthesia and taken to the recovery room in good stable condition with all counts being correct.  She will be followed up in the office in one week for evaluation of the conization bed.  EURE,LUTHER H 01/27/2015 01:20 PM

## 2015-01-30 ENCOUNTER — Telehealth: Payer: Self-pay | Admitting: *Deleted

## 2015-01-30 ENCOUNTER — Telehealth: Payer: Self-pay | Admitting: Obstetrics & Gynecology

## 2015-01-30 NOTE — Telephone Encounter (Signed)
Pt states her Supervisor, Carmie Kanner, states she needs the work note to state pt out of work 09/30-10/06/2014 and can return to work on 01/31/2015 with no restrictions. Note redone and refaxed per pt request.

## 2015-01-30 NOTE — Telephone Encounter (Signed)
Pt states was not able to return to work today after her surgery (Laser Conization of Cervix with Biopsy)  on 01/23/2015, requesting a note to excuse from work today only and faxed to [phone] Attn: Carmie Kanner. Work note completed and faxed.

## 2015-02-03 ENCOUNTER — Ambulatory Visit (INDEPENDENT_AMBULATORY_CARE_PROVIDER_SITE_OTHER): Payer: 59 | Admitting: Obstetrics & Gynecology

## 2015-02-03 ENCOUNTER — Encounter: Payer: Self-pay | Admitting: Obstetrics & Gynecology

## 2015-02-03 VITALS — BP 108/60 | HR 80 | Temp 98.8°F | Wt 104.0 lb

## 2015-02-03 DIAGNOSIS — Z9889 Other specified postprocedural states: Secondary | ICD-10-CM

## 2015-02-03 DIAGNOSIS — R87613 High grade squamous intraepithelial lesion on cytologic smear of cervix (HGSIL): Secondary | ICD-10-CM

## 2015-02-03 MED ORDER — HYDROCODONE-ACETAMINOPHEN 5-325 MG PO TABS: 1.0000 | ORAL_TABLET | Freq: Four times a day (QID) | ORAL | Status: DC | PRN

## 2015-02-03 NOTE — Progress Notes (Signed)
Patient ID: Sheila Merritt, female   DOB: 12-Sep-1990, 24 y.o.   MRN: [id number]  HPI: Patient returns for routine postoperative follow-up having undergone laser conization on 9/28.  The patient's immediate postoperative recovery has been unremarkable. Since hospital discharge the patient reports crampin/P g and flu like illnes.   Current Outpatient Prescriptions: HYDROcodone-acetaminophen (NORCO/VICODIN) 5-325 MG tablet, Take 1 tablet by mouth every 6 (six) hours as needed. (Patient not taking: Reported on 02/03/2015), Disp: 30 tablet, Rfl: 0 ketorolac (TORADOL) 10 MG tablet, Take 1 tablet (10 mg total) by mouth every 8 (eight) hours as needed. (Patient not taking: Reported on 02/03/2015), Disp: 15 tablet, Rfl: 0 Multiple Vitamin (MULTIVITAMIN) tablet, Take 1 tablet by mouth daily., Disp: , Rfl:  norethindrone-ethinyl estradiol (NECON,BREVICON,MODICON) 0.5-35 MG-MCG tablet, Take 1 tablet by mouth daily. (Patient not taking: Reported on 02/03/2015), Disp: 1 Package, Rfl: 11 ondansetron (ZOFRAN) 8 MG tablet, Take 1 tablet (8 mg total) by mouth every 8 (eight) hours as needed for nausea. (Patient not taking: Reported on 02/03/2015), Disp: 12 tablet, Rfl: 0  No current facility-administered medications for this visit.    Blood pressure 108/60, pulse 80, temperature 98.8 F (37.1 C), weight 104 lb (47.174 kg), last menstrual period 01/25/2015.  Physical Exam: Cone bed is healing well  Diagnostic Tests:   Pathology: Margins negative  Impression: S/P laser conization for HSIL  Plan: No sex for 4 weeks  Follow up: 4  weeks  Lazaro Arms, MD

## 2015-03-01 NOTE — Progress Notes (Signed)
Patient ID: Sheila Merritt, female   DOB: [birth year], 24 y.o.   MRN: [id number] Preoperative History and Physical  Saranya Harlin is a 24 y.o. G0P0 with Patient's last menstrual period was 12/03/2014. admitted for a laser conization of the cervix due to HSIL.    PMH:    Past Medical History  Diagnosis Date  . UTI (lower urinary tract infection)   . Abnormal Pap smear   . Anemia   . Vaginal Pap smear, abnormal   . BV (bacterial vaginosis)   . History of abnormal cervical Pap smear 12/08/2014    PSH:     Past Surgical History  Procedure Laterality Date  . No past surgeries    . Cervical conization w/bx N/A 01/27/2015    Procedure: LASER CONIZATION OF CERVIX WITH BIOPSY;  Surgeon: Lazaro Arms, MD;  Location: [facility];  Service: Gynecology;  Laterality: N/A;  . Holmium laser application N/A 01/27/2015    Procedure: HOLMIUM LASER APPLICATION;  Surgeon: Lazaro Arms, MD;  Location: [facility];  Service: Gynecology;  Laterality: N/A;    POb/GynH:      OB History    Gravida Para Term Preterm AB TAB SAB Ectopic Multiple Living   0               SH:   Social History  Substance Use Topics  . Smoking status: Never Smoker   . Smokeless tobacco: Never Used  . Alcohol Use: Yes     Comment: occ    FH:    Family History  Problem Relation Age of Onset  . Hypertension Mother   . Diabetes Mother     borderline  . Heart disease Father   . Heart attack Father     had heart surgery  . Heart disease Maternal Grandfather   . Heart attack Maternal Grandfather   . Heart disease Paternal Grandfather   . Heart attack Paternal Grandfather   . Diabetes Paternal Grandmother   . Diabetes Brother     borderline  . Other Brother     killed in MVA     Allergies: No Known Allergies  Medications:       Current outpatient prescriptions:  .  HYDROcodone-acetaminophen (NORCO/VICODIN) 5-325 MG tablet, Take 1 tablet by mouth every 6 (six) hours as needed., Disp: 30 tablet, Rfl: 0 .  ketorolac  (TORADOL) 10 MG tablet, Take 1 tablet (10 mg total) by mouth every 8 (eight) hours as needed. (Patient not taking: Reported on 02/03/2015), Disp: 15 tablet, Rfl: 0 .  Multiple Vitamin (MULTIVITAMIN) tablet, Take 1 tablet by mouth daily., Disp: , Rfl:  .  norethindrone-ethinyl estradiol (NECON,BREVICON,MODICON) 0.5-35 MG-MCG tablet, Take 1 tablet by mouth daily. (Patient not taking: Reported on 02/03/2015), Disp: 1 Package, Rfl: 11 .  ondansetron (ZOFRAN) 8 MG tablet, Take 1 tablet (8 mg total) by mouth every 8 (eight) hours as needed for nausea. (Patient not taking: Reported on 02/03/2015), Disp: 12 tablet, Rfl: 0  Review of Systems:   Review of Systems  Constitutional: Negative for fever, chills, weight loss, malaise/fatigue and diaphoresis.  HENT: Negative for hearing loss, ear pain, nosebleeds, congestion, sore throat, neck pain, tinnitus and ear discharge.   Eyes: Negative for blurred vision, double vision, photophobia, pain, discharge and redness.  Respiratory: Negative for cough, hemoptysis, sputum production, shortness of breath, wheezing and stridor.   Cardiovascular: Negative for chest pain, palpitations, orthopnea, claudication, leg swelling and PND.  Gastrointestinal: Positive for abdominal pain. Negative for heartburn, nausea, vomiting,  diarrhea, constipation, blood in stool and melena.  Genitourinary: Negative for dysuria, urgency, frequency, hematuria and flank pain.  Musculoskeletal: Negative for myalgias, back pain, joint pain and falls.  Skin: Negative for itching and rash.  Neurological: Negative for dizziness, tingling, tremors, sensory change, speech change, focal weakness, seizures, loss of consciousness, weakness and headaches.  Endo/Heme/Allergies: Negative for environmental allergies and polydipsia. Does not bruise/bleed easily.  Psychiatric/Behavioral: Negative for depression, suicidal ideas, hallucinations, memory loss and substance abuse. The patient is not nervous/anxious  and does not have insomnia.      PHYSICAL EXAM:  Blood pressure 120/62, pulse 60, height 5\' 1"  (1.549 m), weight 107 lb (48.535 kg), last menstrual period 12/03/2014.    Vitals reviewed. Constitutional: She is oriented to person, place, and time. She appears well-developed and well-nourished.  HENT:  Head: Normocephalic and atraumatic.  Right Ear: External ear normal.  Left Ear: External ear normal.  Nose: Nose normal.  Mouth/Throat: Oropharynx is clear and moist.  Eyes: Conjunctivae and EOM are normal. Pupils are equal, round, and reactive to light. Right eye exhibits no discharge. Left eye exhibits no discharge. No scleral icterus.  Neck: Normal range of motion. Neck supple. No tracheal deviation present. No thyromegaly present.  Cardiovascular: Normal rate, regular rhythm, normal heart sounds and intact distal pulses.  Exam reveals no gallop and no friction rub.   No murmur heard. Respiratory: Effort normal and breath sounds normal. No respiratory distress. She has no wheezes. She has no rales. She exhibits no tenderness.  GI: Soft. Bowel sounds are normal. She exhibits no distension and no mass. There is tenderness. There is no rebound and no guarding.  Genitourinary:       Vulva is normal without lesions Vagina is pink moist without discharge Cervix normal in appearance and pap is normal Uterus is normal size, contour, position, consistency, mobility, non-tender Adnexa is negative with normal sized ovaries by sonogram  Musculoskeletal: Normal range of motion. She exhibits no edema and no tenderness.  Neurological: She is alert and oriented to person, place, and time. She has normal reflexes. She displays normal reflexes. No cranial nerve deficit. She exhibits normal muscle tone. Coordination normal.  Skin: Skin is warm and dry. No rash noted. No erythema. No pallor.  Psychiatric: She has a normal mood and affect. Her behavior is normal. Judgment and thought content normal.     Labs: No results found for this or any previous visit (from the past 336 hour(s)).  EKG: No orders found for this or any previous visit.  Imaging Studies: No results found.    Assessment: HSIL Patient Active Problem List   Diagnosis Date Noted  . History of abnormal cervical Pap smear 12/08/2014    Plan: Laser conization of the cervix  Brandelyn Henne H 03/01/2015 10:45 AM

## 2015-03-03 ENCOUNTER — Ambulatory Visit: Payer: 59 | Admitting: Obstetrics & Gynecology

## 2015-12-15 ENCOUNTER — Other Ambulatory Visit: Payer: Self-pay | Admitting: Adult Health

## 2017-09-12 ENCOUNTER — Encounter (HOSPITAL_COMMUNITY): Payer: Self-pay

## 2017-09-12 ENCOUNTER — Other Ambulatory Visit: Payer: Self-pay

## 2017-09-12 ENCOUNTER — Emergency Department (HOSPITAL_COMMUNITY)
Admission: EM | Admit: 2017-09-12 | Discharge: 2017-09-12 | Disposition: A | Discharge status: Home / Self Care | Payer: Self-pay | Attending: Emergency Medicine | Admitting: Emergency Medicine

## 2017-09-12 ENCOUNTER — Emergency Department (HOSPITAL_COMMUNITY): Payer: Self-pay

## 2017-09-12 DIAGNOSIS — X58XXXA Exposure to other specified factors, initial encounter: Secondary | ICD-10-CM | POA: Insufficient documentation

## 2017-09-12 DIAGNOSIS — S2231XA Fracture of one rib, right side, initial encounter for closed fracture: Secondary | ICD-10-CM

## 2017-09-12 DIAGNOSIS — Y929 Unspecified place or not applicable: Secondary | ICD-10-CM | POA: Insufficient documentation

## 2017-09-12 DIAGNOSIS — Y9389 Activity, other specified: Secondary | ICD-10-CM | POA: Insufficient documentation

## 2017-09-12 DIAGNOSIS — Y999 Unspecified external cause status: Secondary | ICD-10-CM | POA: Insufficient documentation

## 2017-09-12 DIAGNOSIS — Z79899 Other long term (current) drug therapy: Secondary | ICD-10-CM | POA: Insufficient documentation

## 2017-09-12 MED ORDER — HYDROCODONE-ACETAMINOPHEN 5-325 MG PO TABS: 1.0000 | ORAL_TABLET | ORAL | 0 refills | Status: DC | PRN

## 2017-09-12 MED ORDER — ALBUTEROL SULFATE HFA 108 (90 BASE) MCG/ACT IN AERS
2.0000 | INHALATION_SPRAY | Freq: Once | RESPIRATORY_TRACT | Status: AC
  Administered 2017-09-12: 2 via RESPIRATORY_TRACT
  Filled 2017-09-12: qty 6.7

## 2017-09-12 MED ORDER — HYDROCODONE-ACETAMINOPHEN 5-325 MG PO TABS
1.0000 | ORAL_TABLET | Freq: Once | ORAL | Status: AC
  Administered 2017-09-12: 1 via ORAL
  Filled 2017-09-12: qty 1

## 2017-09-12 MED ORDER — NAPROXEN 500 MG PO TABS: 500.0000 mg | ORAL_TABLET | Freq: Two times a day (BID) | ORAL | 0 refills | Status: DC

## 2017-09-12 NOTE — ED Provider Notes (Signed)
[facility]-EMERGENCY DEPT Provider Note   CSN: [id number] Arrival date & time: 09/12/17  1418     History   Chief Complaint Chief Complaint  Patient presents with  . Rib Pain    HPI Sheila Merritt is a 27 y.o. female.  Sheila Merritt is a 27 y.o. Female who presents to the emergency department for evaluation of right-sided rib pain.  Patient is able to localize pain to port tenderness over the right lateral ribs around the seventh or eighth rib.  Patient reports she has had some pain intermittently in this area for the last 3 to 4 months, she is unsure if she hit it on anything or had any injury, but patient reports today she had a coughing spell and felt a pop in this area and then had worsening pain.  Patient reports it hurts to take a deep breath but she does not feel short of breath, no chest pain elsewhere.  Patient has not noticed any bruising or redness over this area.  No pain in the back.  Patient reports she has been taking Naprosyn for her knee injury that happened recently, but this really has not helped this pain very much has not tried anything else to treat this pain.  Patient denies any fevers or chills.  She does report that her allergies have been exacerbated recently, she has had a lot of nasal congestion, rhinorrhea, sneezing and cough and has felt wheezy occasionally.  Patient reports she does not have a history of asthma, is not a smoker, but her allergies have never been a severe.  Patient has not been taking anything for her allergies recently, but thinks this is why she has been coughing so much recently.  Patient denies any abdominal pain nausea or vomiting     Past Medical History:  Diagnosis Date  . Abnormal Pap smear   . Anemia   . BV (bacterial vaginosis)   . History of abnormal cervical Pap smear 12/08/2014  . UTI (lower urinary tract infection)   . Vaginal Pap smear, abnormal     Patient Active Problem List   Diagnosis Date  Noted  . History of abnormal cervical Pap smear 12/08/2014    Past Surgical History:  Procedure Laterality Date  . CERVICAL CONIZATION W/BX N/A 01/27/2015   Procedure: LASER CONIZATION OF CERVIX WITH BIOPSY;  Surgeon: Lazaro Arms, MD;  Location: [facility];  Service: Gynecology;  Laterality: N/A;  . HOLMIUM LASER APPLICATION N/A 01/27/2015   Procedure: HOLMIUM LASER APPLICATION;  Surgeon: Lazaro Arms, MD;  Location: [facility];  Service: Gynecology;  Laterality: N/A;  . NO PAST SURGERIES       OB History    Gravida  0   Para      Term      Preterm      AB      Living        SAB      TAB      Ectopic      Multiple      Live Births               Home Medications    Prior to Admission medications   Medication Sig Start Date End Date Taking? Authorizing Provider  HYDROcodone-acetaminophen (NORCO) 5-325 MG tablet Take 1 tablet by mouth every 4 (four) hours as needed. 09/12/17   Dartha Lodge, PA-C  ketorolac (TORADOL) 10 MG tablet Take 1 tablet (10 mg total) by mouth  every 8 (eight) hours as needed. Patient not taking: Reported on 02/03/2015 01/27/15   Lazaro Arms, MD  Multiple Vitamin (MULTIVITAMIN) tablet Take 1 tablet by mouth daily.    [provider]  naproxen (NAPROSYN) 500 MG tablet Take 1 tablet (500 mg total) by mouth 2 (two) times daily. 09/12/17   Dartha Lodge, PA-C  NORTREL 0.5/35, 28, 0.5-35 MG-MCG tablet TAKE ONE TABLET BY MOUTH ONCE DAILY 12/18/15   Cyril Mourning A, NP  ondansetron (ZOFRAN) 8 MG tablet Take 1 tablet (8 mg total) by mouth every 8 (eight) hours as needed for nausea. Patient not taking: Reported on 02/03/2015 01/27/15   Lazaro Arms, MD    Family History Family History  Problem Relation Age of Onset  . Hypertension Mother   . Diabetes Mother        borderline  . Heart disease Father   . Heart attack Father        had heart surgery  . Heart disease Maternal Grandfather   . Heart attack Maternal Grandfather   . Heart  disease Paternal Grandfather   . Heart attack Paternal Grandfather   . Diabetes Paternal Grandmother   . Diabetes Brother        borderline  . Other Brother        killed in MVA    Social History Social History   Tobacco Use  . Smoking status: Never Smoker  . Smokeless tobacco: Never Used  Substance Use Topics  . Alcohol use: Yes    Comment: occ  . Drug use: No     Allergies   Patient has no known allergies.   Review of Systems Review of Systems  Constitutional: Negative for chills and fever.  HENT: Positive for congestion, postnasal drip, rhinorrhea, sinus pressure and sneezing. Negative for sore throat.   Respiratory: Positive for cough and wheezing. Negative for chest tightness and shortness of breath.   Cardiovascular: Positive for chest pain (Rib pain). Negative for palpitations and leg swelling.  Gastrointestinal: Negative for abdominal pain, nausea and vomiting.  Genitourinary: Negative for dysuria.  Musculoskeletal: Negative for arthralgias and myalgias.  Skin: Negative for color change, rash and wound.  Neurological: Negative for dizziness, syncope and headaches.     Physical Exam Updated Vital Signs BP (!) 116/97 (BP Location: Left Arm)   Pulse 78   Temp 98.2 F (36.8 C) (Oral)   Resp 20   Ht  (1.549 m)   Wt 47.6 kg (105 lb)   LMP 09/05/2017 (Approximate)   SpO2 94%   BMI 19.84 kg/m   Physical Exam  Constitutional: She appears well-developed and well-nourished. No distress.  HENT:  Head: Normocephalic and atraumatic.  Eyes: Right eye exhibits no discharge. Left eye exhibits no discharge.  Cardiovascular: Normal rate, regular rhythm, normal heart sounds and intact distal pulses.  Pulmonary/Chest: Effort normal. No stridor. No respiratory distress. She has wheezes. She has no rales. She exhibits tenderness.  Respirations equal and unlabored, patient able to speak in full sentences, lungs scattered mild end expiratory wheezes but good air  movement throughout, there is localized point tenderness over the right chest wall, around ribs 7-8, no overlying erythema, skin changes or ecchymosis.  No palpable crepitus or obvious deformity, pain is re-created in this area even with palpation on the posterior ribs  Abdominal: Soft. Bowel sounds are normal. She exhibits no distension and no mass. There is no tenderness. There is no guarding.  Abdomen soft, nondistended, nontender to palpation  in all quadrants without guarding or peritoneal signs  Neurological: She is alert. Coordination normal.  Skin: Skin is warm and dry. Capillary refill takes less than 2 seconds. She is not diaphoretic.  Psychiatric: She has a normal mood and affect. Her behavior is normal.  Nursing note and vitals reviewed.    ED Treatments / Results  Labs (all labs ordered are listed, but only abnormal results are displayed) Labs Reviewed - No data to display  EKG None  Radiology Dg Ribs Unilateral W/chest Right  Result Date: 09/12/2017 CLINICAL DATA:  Acute chest and RIGHT rib pain following cough. EXAM: RIGHT RIBS AND CHEST - 3+ VIEW COMPARISON:  None. FINDINGS: A nondisplaced fracture of the LATERAL RIGHT seventh rib is noted. No other acute bony abnormalities are present. Cardiomediastinal silhouette is unremarkable. There is no evidence of focal airspace disease, pulmonary edema, suspicious pulmonary nodule/mass, pleural effusion, or pneumothorax. IMPRESSION: Nondisplaced fracture of the RIGHT seventh rib. No other significant abnormalities. Electronically Signed   By: Harmon Pier M.D.   On: 09/12/2017 18:00    Procedures Procedures (including critical care time)  Medications Ordered in ED Medications  albuterol (PROVENTIL HFA;VENTOLIN HFA) 108 (90 Base) MCG/ACT inhaler 2 puff (2 puffs Inhalation Given 09/12/17 1838)  HYDROcodone-acetaminophen (NORCO/VICODIN) 5-325 MG per tablet 1 tablet (1 tablet Oral Given 09/12/17 1838)     Initial Impression /  Assessment and Plan / ED Course  I have reviewed the triage vital signs and the nursing notes.  Pertinent labs & imaging results that were available during my care of the patient were reviewed by me and considered in my medical decision making (see chart for details).  Patient presents to the ED for evaluation of right rib pain.  She has had pain intermittently over the last few weeks, but today had a coughing spell, felt a pop in this area and pain has been worse since then.  She reports she has had worsening allergies recently causing frequent cough.  Vitals normal, patient in no acute distress.  On exam patient has some scattered expiratory wheezing, point tenderness over the right ribs, without obvious deformity on exam.  Will get chest x-ray with dedicated right rib views.  X-ray shows nondisplaced fracture of the right seventh rib, there is no evidence of pneumothorax, pneumonia or any other acute cardiopulmonary abnormalities.  Will treat pain with Norco and NSAIDs.  Patient provided albuterol inhaler here in the ED with complete resolution of wheezing.  Encourage patient to use Zyrtec and Flonase to help with allergies, Mucinex or Robitussin for cough.  Patient provided with incentive spirometer and educated on its use.  She is to follow-up with the [facility] wellness clinic.  Strict return precautions discussed.  Patient expresses understanding.  Final Clinical Impressions(s) / ED Diagnoses   Final diagnoses:  Closed traumatic nondisplaced fracture of one rib of right side, initial encounter    ED Discharge Orders        Ordered    HYDROcodone-acetaminophen (NORCO) 5-325 MG tablet  Every 4 hours PRN     09/12/17 1843    naproxen (NAPROSYN) 500 MG tablet  2 times daily     09/12/17 1843       Dartha Lodge, PA-C 09/12/17 1908    Melene Plan, DO 09/12/17 2301

## 2017-09-12 NOTE — Discharge Instructions (Addendum)
Your x-ray shows a small nondisplaced fracture of the right seventh rib.  Please use pain medication as needed.  Is also extremely important that you do incentive spirometry at least 4 times a day this helps ensure that you are taking good deep breaths to prevent pneumonia.  You may also use ice or heat over the area.  Please follow-up with the [facility] for continued evaluation.  Return to the emergency department if any of the below scenarios develop.  Albuterol inhaler, as well as daily Flonase and Zyrtec to help with allergies. Mucinex or Robitussin for cough.  Get help right away if: You have a fever. You have trouble breathing. You cannot stop coughing. You cough up thick or bloody spit (mucus). You feel sick to your stomach (nauseous), throw up (vomit), or have belly (abdominal) pain. Your pain gets worse and medicine does not help.

## 2017-09-12 NOTE — ED Triage Notes (Signed)
Pt c/o R rib pain x 3-4 months. She states that it worsening this morning. Pain is worse with coughing. A&Ox4. Ambulatory.

## 2017-09-12 NOTE — ED Notes (Signed)
Pt is alert and oriented x 4 and is verbally responsive. Pt reports 9/10 rt side rib aches that worsen with movement.

## 2019-09-29 DIAGNOSIS — F419 Anxiety disorder, unspecified: Secondary | ICD-10-CM | POA: Insufficient documentation

## 2019-10-10 IMAGING — CR DG RIBS W/ CHEST 3+V*R*
3 series · 3 of 3 positions shown · non-contrast
Comparison: None.

CLINICAL DATA: Acute chest and RIGHT rib pain following cough.

EXAM:
RIGHT RIBS AND CHEST - 3+ VIEW

[w chest pa]
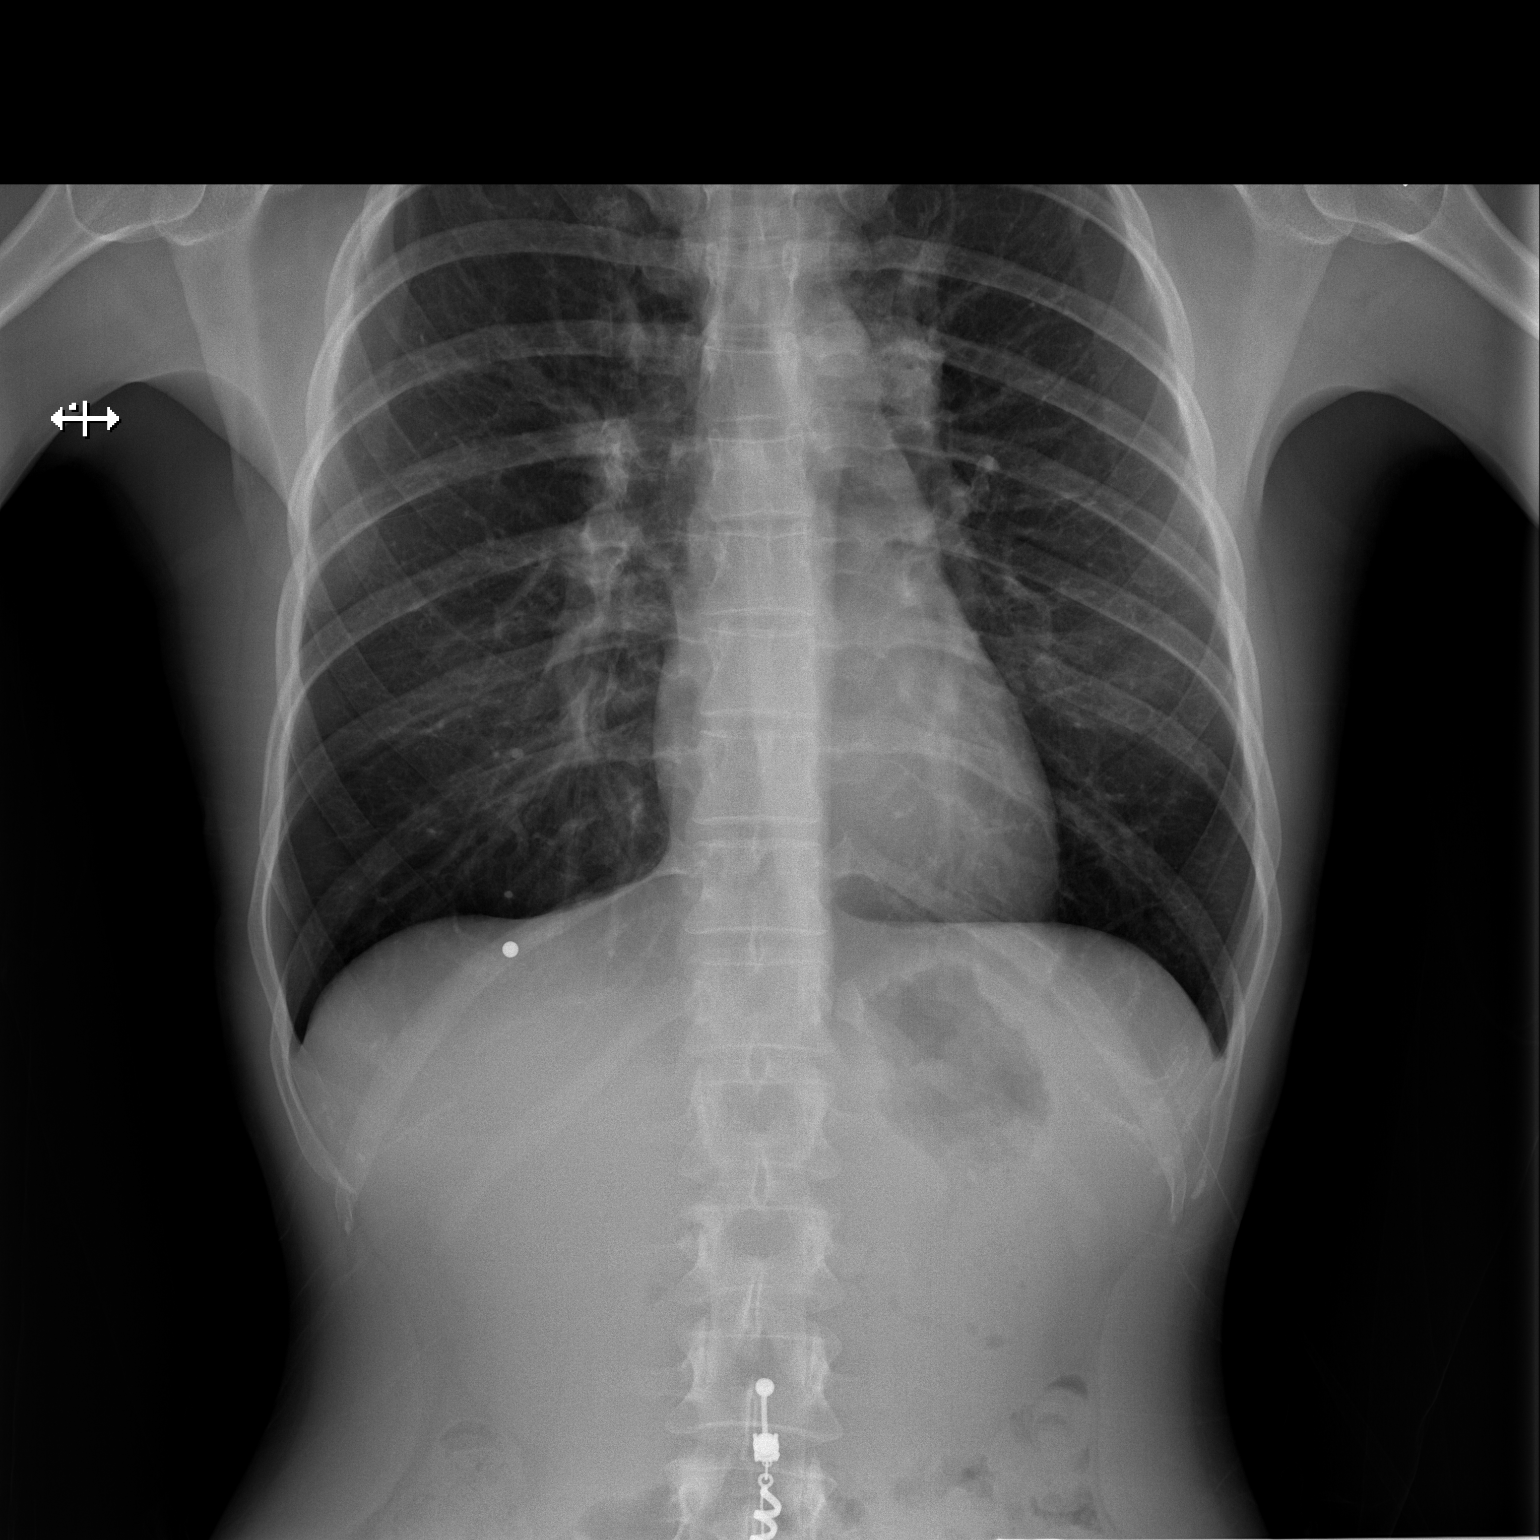

[w ribs ap upper right]
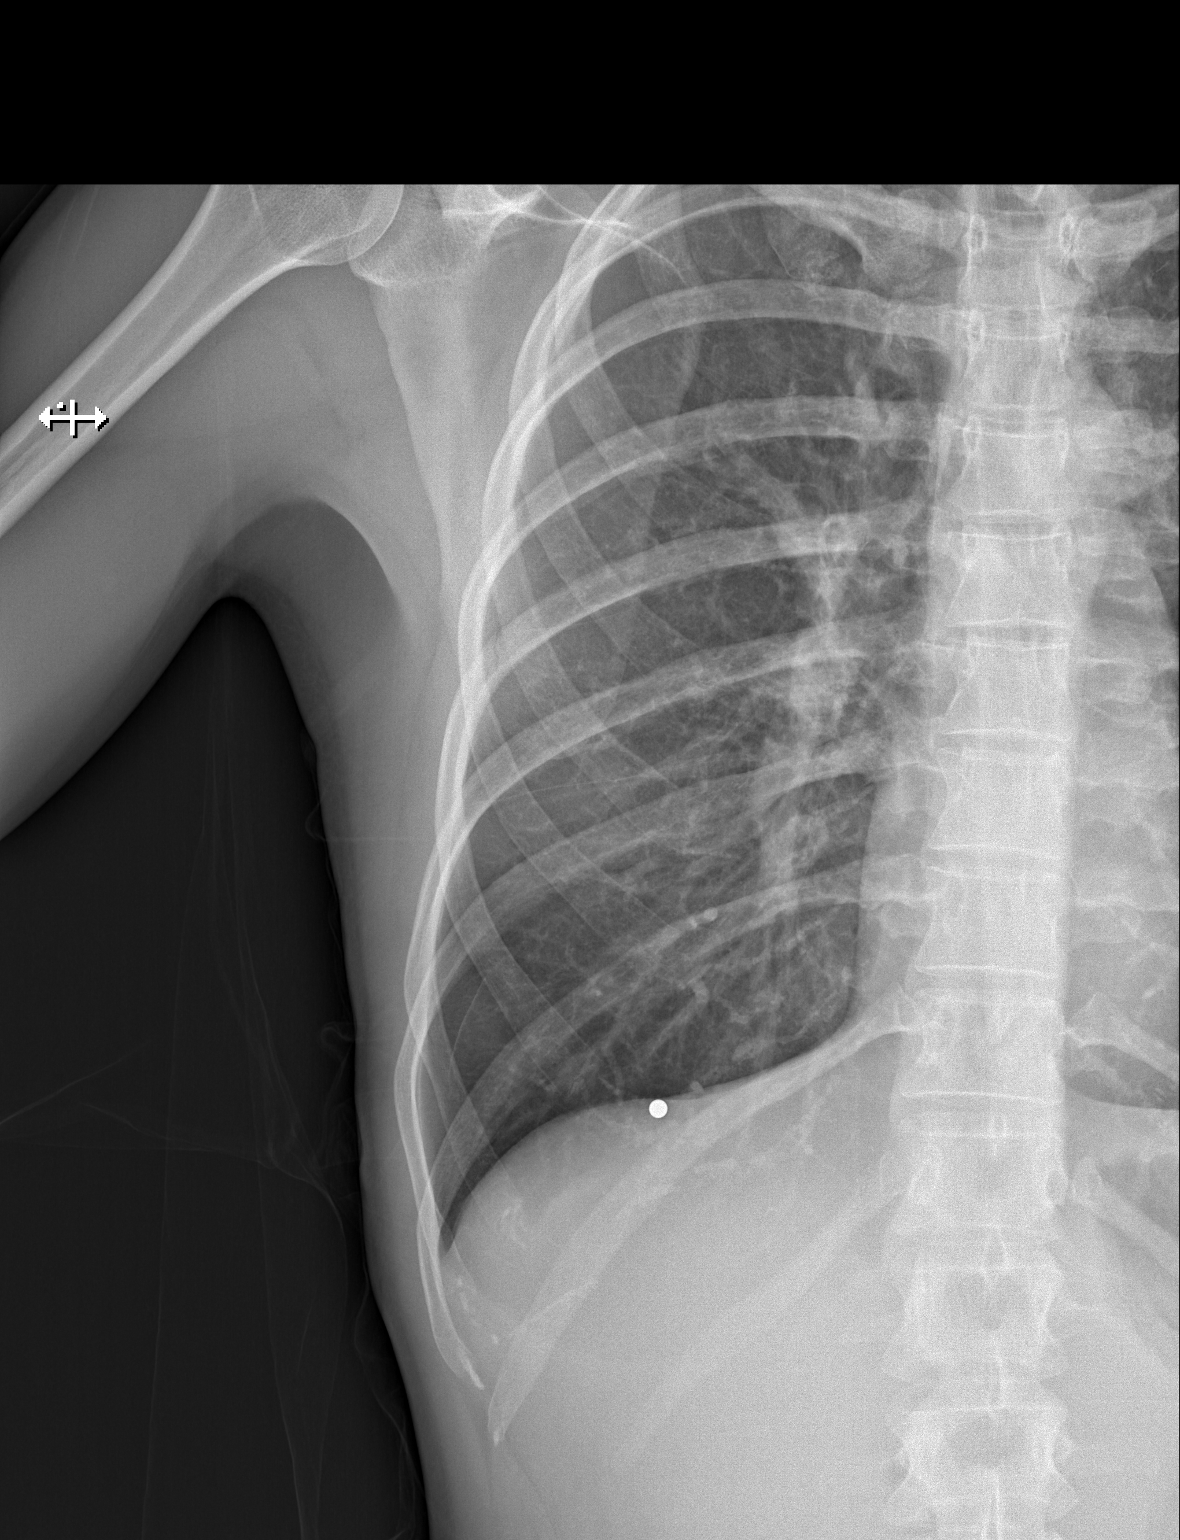

[w ribs obl right]
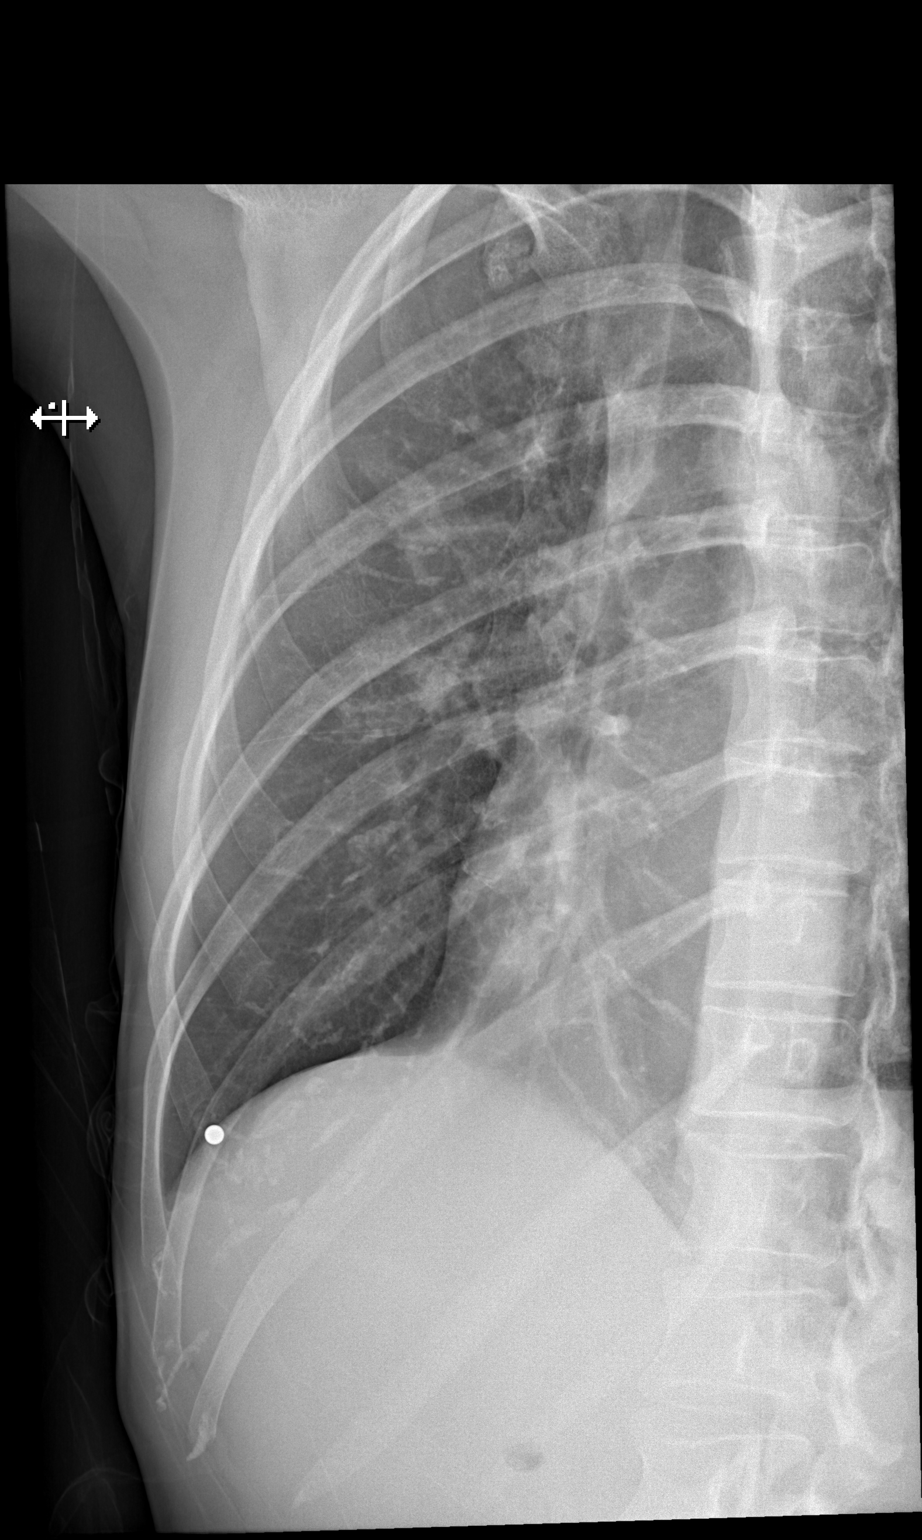

[3 of 3 positions shown; findings below may reference images not displayed]

FINDINGS: A nondisplaced fracture of the LATERAL RIGHT seventh rib is noted.

No other acute bony abnormalities are present.

Cardiomediastinal silhouette is unremarkable.

There is no evidence of focal airspace disease, pulmonary edema,
suspicious pulmonary nodule/mass, pleural effusion, or pneumothorax.
IMPRESSION: Nondisplaced fracture of the RIGHT seventh rib. No other significant
abnormalities.

## 2020-06-14 DIAGNOSIS — B182 Chronic viral hepatitis C: Secondary | ICD-10-CM | POA: Insufficient documentation

## 2020-10-18 LAB — HM PAP SMEAR

## 2021-07-04 DIAGNOSIS — T50905A Adverse effect of unspecified drugs, medicaments and biological substances, initial encounter: Secondary | ICD-10-CM | POA: Diagnosis not present

## 2021-07-04 DIAGNOSIS — R69 Illness, unspecified: Secondary | ICD-10-CM | POA: Diagnosis not present

## 2021-07-04 DIAGNOSIS — Y92512 Supermarket, store or market as the place of occurrence of the external cause: Secondary | ICD-10-CM | POA: Diagnosis not present

## 2021-07-04 DIAGNOSIS — R4781 Slurred speech: Secondary | ICD-10-CM | POA: Diagnosis not present

## 2021-07-04 DIAGNOSIS — Z289 Immunization not carried out for unspecified reason: Secondary | ICD-10-CM | POA: Diagnosis not present

## 2021-07-04 DIAGNOSIS — Z7722 Contact with and (suspected) exposure to environmental tobacco smoke (acute) (chronic): Secondary | ICD-10-CM | POA: Diagnosis not present

## 2021-07-04 DIAGNOSIS — R5383 Other fatigue: Secondary | ICD-10-CM | POA: Diagnosis not present

## 2021-07-04 DIAGNOSIS — Z28310 Unvaccinated for covid-19: Secondary | ICD-10-CM | POA: Diagnosis not present

## 2021-07-06 ENCOUNTER — Ambulatory Visit (INDEPENDENT_AMBULATORY_CARE_PROVIDER_SITE_OTHER): Payer: 59 | Admitting: Family Medicine

## 2021-07-06 ENCOUNTER — Encounter: Payer: Self-pay | Admitting: Family Medicine

## 2021-07-06 VITALS — BP 107/63 | HR 90 | Temp 98.2°F | Resp 20 | Ht 61.0 in | Wt 109.0 lb

## 2021-07-06 DIAGNOSIS — Z7689 Persons encountering health services in other specified circumstances: Secondary | ICD-10-CM

## 2021-07-06 DIAGNOSIS — B182 Chronic viral hepatitis C: Secondary | ICD-10-CM

## 2021-07-06 DIAGNOSIS — R195 Other fecal abnormalities: Secondary | ICD-10-CM

## 2021-07-06 DIAGNOSIS — D508 Other iron deficiency anemias: Secondary | ICD-10-CM | POA: Diagnosis not present

## 2021-07-06 DIAGNOSIS — F339 Major depressive disorder, recurrent, unspecified: Secondary | ICD-10-CM | POA: Insufficient documentation

## 2021-07-06 DIAGNOSIS — R69 Illness, unspecified: Secondary | ICD-10-CM | POA: Diagnosis not present

## 2021-07-06 DIAGNOSIS — F1911 Other psychoactive substance abuse, in remission: Secondary | ICD-10-CM | POA: Diagnosis not present

## 2021-07-06 DIAGNOSIS — F419 Anxiety disorder, unspecified: Secondary | ICD-10-CM | POA: Diagnosis not present

## 2021-07-06 MED ORDER — ESCITALOPRAM OXALATE 10 MG PO TABS: 10.0000 mg | ORAL_TABLET | Freq: Every day | ORAL | 3 refills | Status: DC
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
## 2021-07-10 ENCOUNTER — Encounter: Payer: Self-pay | Admitting: Family Medicine

## 2021-07-10 ENCOUNTER — Other Ambulatory Visit: Payer: 59

## 2021-07-10 DIAGNOSIS — R195 Other fecal abnormalities: Secondary | ICD-10-CM | POA: Diagnosis not present

## 2021-07-10 DIAGNOSIS — Z7689 Persons encountering health services in other specified circumstances: Secondary | ICD-10-CM | POA: Diagnosis not present

## 2021-07-12 ENCOUNTER — Encounter: Payer: Self-pay | Admitting: Family Medicine

## 2021-07-12 LAB — ANEMIA PROFILE B
Basophils Absolute: 0 10*3/uL (ref 0.0–0.2)
Basos: 1 %
EOS (ABSOLUTE): 0.2 10*3/uL (ref 0.0–0.4)
Eos: 4 %
Ferritin: 105 ng/mL (ref 15–150)
Folate: 15.2 ng/mL (ref >3.0)
Hematocrit: 36 % (ref 34.0–46.6)
Hemoglobin: 12 g/dL (ref 11.1–15.9)
Immature Grans (Abs): 0 10*3/uL (ref 0.0–0.1)
Immature Granulocytes: 0 %
Iron Saturation: 27 % (ref 15–55)
Iron: 85 ug/dL (ref 27–159)
Lymphocytes Absolute: 1.2 10*3/uL (ref 0.7–3.1)
Lymphs: 25 %
MCH: 30.5 pg (ref 26.6–33.0)
MCHC: 33.3 g/dL (ref 31.5–35.7)
MCV: 91 fL (ref 79–97)
Monocytes Absolute: 0.4 10*3/uL (ref 0.1–0.9)
Monocytes: 8 %
Neutrophils Absolute: 3 10*3/uL (ref 1.4–7.0)
Neutrophils: 62 %
Platelets: 246 10*3/uL (ref 150–450)
RBC: 3.94 x10E6/uL (ref 3.77–5.28)
RDW: 12.2 % (ref 11.7–15.4)
Retic Ct Pct: 0.9 % (ref 0.6–2.6)
Total Iron Binding Capacity: 311 ug/dL (ref 250–450)
UIBC: 226 ug/dL (ref 131–425)
Vitamin B-12: 928 pg/mL (ref 232–1245)
WBC: 4.7 10*3/uL (ref 3.4–10.8)

## 2021-07-12 LAB — CMP14+EGFR
ALT: 17 IU/L (ref 0–32)
AST: 25 IU/L (ref 0–40)
Albumin/Globulin Ratio: 1.7 (ref 1.2–2.2)
Albumin: 4.3 g/dL (ref 3.8–4.8)
Alkaline Phosphatase: 48 IU/L (ref 44–121)
BUN/Creatinine Ratio: 11 (ref 9–23)
BUN: 8 mg/dL (ref 6–20)
Bilirubin Total: 0.3 mg/dL (ref 0.0–1.2)
CO2: 26 mmol/L (ref 20–29)
Calcium: 9.3 mg/dL (ref 8.7–10.2)
Chloride: 104 mmol/L (ref 96–106)
Creatinine, Ser: 0.75 mg/dL (ref 0.57–1.00)
Globulin, Total: 2.5 g/dL (ref 1.5–4.5)
Glucose: 111 mg/dL — ABNORMAL HIGH (ref 70–99)
Potassium: 4.7 mmol/L (ref 3.5–5.2)
Sodium: 143 mmol/L (ref 134–144)
Total Protein: 6.8 g/dL (ref 6.0–8.5)
eGFR: 109 mL/min/{1.73_m2} (ref >59)

## 2021-07-12 LAB — THYROID PANEL WITH TSH
Free Thyroxine Index: 1.8 (ref 1.2–4.9)
T3 Uptake Ratio: 27 % (ref 24–39)
T4, Total: 6.5 ug/dL (ref 4.5–12.0)
TSH: 0.756 u[IU]/mL (ref 0.450–4.500)

## 2021-07-12 LAB — PARASITE EXAM, BLOOD

## 2021-07-12 LAB — HEPATITIS C ANTIBODY: Hep C Virus Ab: REACTIVE — AB

## 2021-07-12 LAB — HIV ANTIBODY (ROUTINE TESTING W REFLEX): HIV Screen 4th Generation wRfx: NONREACTIVE

## 2021-07-13 ENCOUNTER — Telehealth: Payer: Self-pay | Admitting: Family Medicine

## 2021-07-13 ENCOUNTER — Encounter: Payer: Self-pay | Admitting: Family Medicine

## 2021-07-13 NOTE — Telephone Encounter (Signed)
REFERRAL REQUEST ?Telephone Note ? ?Have you been seen at our office for this problem? Yes ?(Advise that they may need an appointment with their PCP before a referral can be done) ? ?Reason for Referral: parasites ?Referral discussed with patient: yes  ?Best contact number of patient for referral team: [phone]    ?Has patient been seen by a specialist for this issue before: no  ?[facility] ?Patient location preference for referral: [facility] ?  ?Patient notified that referrals can take up to a week or longer to process. If they haven't heard anything within a week they should call back and speak with the referral department.  ? ?

## 2021-07-16 NOTE — Addendum Note (Signed)
Addended by: Baruch Gouty on: 07/16/2021 10:18 PM ? ? Modules accepted: Orders ? ?

## 2021-07-16 NOTE — Telephone Encounter (Signed)
I have referred her to GI. If they feel she needs to see ID, we can refer then. All labs have been unremarkable. ?

## 2021-07-16 NOTE — Telephone Encounter (Signed)
Hold for Sheila Merritt- I think she needs colonoscopy- not sure of background to this ?

## 2021-07-18 ENCOUNTER — Telehealth: Payer: Self-pay | Admitting: Gastroenterology

## 2021-07-18 LAB — CDIFF NAA+O+P+STOOL CULTURE
E coli, Shiga toxin Assay: NEGATIVE
Toxigenic C. Difficile by PCR: NEGATIVE

## 2021-07-18 NOTE — Telephone Encounter (Signed)
She may be offered a transfer request. ?Let her know specifically that our group does not actively treat hepatitis C (although she is already been treated for it) and we just monitor patient's posttreatment from a liver standpoint. ?She can be offered a new patient visit with me in normal circumstance (do not offer held slot or 3:50 PM/11:50 AM slots). ?Thanks. ?GM ?

## 2021-07-18 NOTE — Telephone Encounter (Signed)
Good afternoon Dr. Meridee Score, ? ?(DoD for 07/18/21, p.m.) ? ?This patient called requesting a transfer of care.  She has previously been seen only for office visits at [facility] (records in [place]) and the only reason she went there was because her insurance mandated she go there.  Our office is more convenient for her and her PCP has recommended [place] highly to her.  She has changed insurance and would like to start being treated here.  She has issues with Chronic Hep C and abnormal stools.  Please let me know if you approve the transfer.   ? ?Thank you. ?

## 2021-07-18 NOTE — Telephone Encounter (Signed)
Pt informed and understood. She will wait for GI to call for an appt. ?

## 2021-07-20 ENCOUNTER — Encounter: Payer: Self-pay | Admitting: Gastroenterology

## 2021-07-20 NOTE — Telephone Encounter (Signed)
Patient schedule OV 4/28 for abnormal stool. States Hep C is already being treated ?

## 2021-08-05 ENCOUNTER — Telehealth: Payer: 59 | Admitting: Family

## 2021-08-05 DIAGNOSIS — L811 Chloasma: Secondary | ICD-10-CM

## 2021-08-05 DIAGNOSIS — H1012 Acute atopic conjunctivitis, left eye: Secondary | ICD-10-CM

## 2021-08-05 MED ORDER — LEVOCETIRIZINE DIHYDROCHLORIDE 5 MG PO TABS: 5.0000 mg | ORAL_TABLET | Freq: Every evening | ORAL | 1 refills | Status: DC

## 2021-08-05 MED ORDER — TRETINOIN 0.025 % EX CREA: TOPICAL_CREAM | Freq: Every day | CUTANEOUS | 0 refills | Status: DC

## 2021-08-05 MED ORDER — TETRAHYDROZOLINE HCL 0.05 % OP SOLN: 2.0000 [drp] | Freq: Three times a day (TID) | OPHTHALMIC | 0 refills | Status: DC

## 2021-08-05 NOTE — Progress Notes (Signed)
E Visit for Rash ? ?We are sorry that you are not feeling well. Here is how we plan to help! ? ?Based on your pictures, it appears you have melasma. This is caused from hormones and sunlight. I can send over a prescription, but it can be pricey because insurance does cover as they states this is cosmetic.  You can use OTC creams also.  ? ?I have sent in tretinoin 0.0025% you use at bed time. This can cause sensitivity to sun.  ?HOME CARE: ? ?Take cool showers and avoid direct sunlight. ?Apply cool compress or wet dressings. ?Take a bath in an oatmeal bath.  Sprinkle content of one Aveeno packet under running faucet with comfortably warm water.  Bathe for 15-20 minutes, 1-2 times daily.  Pat dry with a towel. Do not rub the rash. ?Use hydrocortisone cream. ?Take an antihistamine like Benadryl for widespread rashes that itch.  The adult dose of Benadryl is 25-50 mg by mouth 4 times daily. ?Caution:  This type of medication may cause sleepiness.  Do not drink alcohol, drive, or operate dangerous machinery while taking antihistamines.  Do not take these medications if you have prostate enlargement.  Read package instructions thoroughly on all medications that you take. ? ?GET HELP RIGHT AWAY IF: ? ?Symptoms don't go away after treatment. ?Severe itching that persists. ?If you rash spreads or swells. ?If you rash begins to smell. ?If it blisters and opens or develops a yellow-brown crust. ?You develop a fever. ?You have a sore throat. ?You become short of breath. ? ?MAKE SURE YOU: ? ?Understand these instructions. ?Will watch your condition. ?Will get help right away if you are not doing well or get worse. ? ?Thank you for choosing an e-visit. ? ?Your e-visit answers were reviewed by a board certified advanced clinical practitioner to complete your personal care plan. Depending upon the condition, your plan could have included both over the counter or prescription medications. ? ?Please review your pharmacy choice. Make  sure the pharmacy is open so you can pick up prescription now. If there is a problem, you may contact your provider through [employer] and have the prescription routed to another pharmacy.  Your safety is important to [place]. If you have drug allergies check your prescription carefully.  ? ?For the next 24 hours you can use MyChart to ask questions about today's visit, request a non-urgent call back, or ask for a work or school excuse. ?You will get an email in the next two days asking about your experience. I hope that your e-visit has been valuable and will speed your recovery. ? ?Approximately 5 minutes was spent documenting and reviewing patient's chart.  ? ?

## 2021-08-05 NOTE — Progress Notes (Signed)
E visit for Allergic Rhinitis ?We are sorry that you are not feeling well.  Here is how we plan to help! ? ?Based on what you have shared with me it looks like you have Allergic Rhinitis.  Rhinitis is when a reaction occurs that causes nasal congestion, runny nose, sneezing, and itching.  Most types of rhinitis are caused by an inflammation and are associated with symptoms in the eyes ears or throat. ?There are several types of rhinitis.  The most common are acute rhinitis, which is usually caused by a viral illness, allergic or seasonal rhinitis, and nonallergic or year-round rhinitis.  Nasal allergies occur certain times of the year.  Allergic rhinitis is caused when allergens in the air trigger the release of histamine in the body.  Histamine causes itching, swelling, and fluid to build up in the fragile linings of the nasal passages, sinuses and eyelids.  An itchy nose and clear discharge are common. ? ?I recommend the following over the counter treatments: ?You should take a daily dose of antihistamine and Xyzal 5 mg take 1 tablet daily ? ? ? ?You may also benefit from eye drops such as: ?Visine 1-2 drops each eye twice daily as needed ? ?HOME CARE: ? ?You can use an over-the-counter saline nasal spray as needed ?Avoid areas where there is heavy dust, mites, or molds ?Stay indoors on windy days during the pollen season ?Keep windows closed in home, at least in bedroom; use air conditioner. ?Use high-efficiency house air filter ?Keep windows closed in car, turn [facility] on re-circulate ?Avoid playing out with dog during pollen season ? ?GET HELP RIGHT AWAY IF: ? ?If your symptoms do not improve within 10 days ?You become short of breath ?You develop yellow or green discharge from your nose for over 3 days ?You have coughing fits ? ?MAKE SURE YOU: ? ?Understand these instructions ?Will watch your condition ?Will get help right away if you are not doing well or get worse ? ?Thank you for choosing an e-visit. ?Your  e-visit answers were reviewed by a board certified advanced clinical practitioner to complete your personal care plan. Depending upon the condition, your plan could have included both over the counter or prescription medications. ?Please review your pharmacy choice. Be sure that the pharmacy you have chosen is open so that you can pick up your prescription now.  If there is a problem you may message your provider in MyChart to have the prescription routed to another pharmacy. ?Your safety is important to [place]. If you have drug allergies check your prescription carefully.  ?For the next 24 hours, you can use MyChart to ask questions about today?s visit, request a non-urgent call back, or ask for a work or school excuse from your e-visit provider. ?You will get an email in the next two days asking about your experience. I hope that your e-visit has been valuable and will speed your recovery. ? ? ? ? ?Approximately 5 minutes was spent documenting and reviewing patient's chart.  ? ? ? ?

## 2021-08-05 NOTE — Progress Notes (Signed)
See previous evisit.   Marilene Vath, FNP  

## 2021-08-12 ENCOUNTER — Telehealth: Payer: 59 | Admitting: Physician Assistant

## 2021-08-12 DIAGNOSIS — L811 Chloasma: Secondary | ICD-10-CM

## 2021-08-12 NOTE — Progress Notes (Signed)
Patient just completed EV on 04/09. Advised that rash will take a long time to resolve. Sent more information and advised to follow up with PCP or Dermatology. No charge EV ?

## 2021-08-13 ENCOUNTER — Encounter: Payer: Self-pay | Admitting: Family Medicine

## 2021-08-13 ENCOUNTER — Telehealth: Payer: 59 | Admitting: Nurse Practitioner

## 2021-08-13 ENCOUNTER — Encounter: Payer: Self-pay | Admitting: Nurse Practitioner

## 2021-08-13 DIAGNOSIS — J0101 Acute recurrent maxillary sinusitis: Secondary | ICD-10-CM

## 2021-08-13 DIAGNOSIS — H539 Unspecified visual disturbance: Secondary | ICD-10-CM

## 2021-08-13 MED ORDER — FLUTICASONE PROPIONATE 50 MCG/ACT NA SUSP: 2.0000 | Freq: Every day | NASAL | 6 refills | Status: DC

## 2021-08-13 NOTE — Addendum Note (Signed)
Addended by: Bennie Pierini on: 08/13/2021 06:48 PM ? ? Modules accepted: Orders ? ?

## 2021-08-13 NOTE — Progress Notes (Signed)
Based on what you shared with me it looks like you have visual problems,that should be evaluated in a face to face office visit. Need  to see an eye doctor. ? ?NOTE: There will be NO CHARGE for this eVisit ?  ?If you are having a true medical emergency please call 911.   ?  ? For an urgent face to face visit, [place] has six urgent care centers for your convenience:  ?  ? [facility] [place] [facility] ?Get Driving Directions ?[phone] ?[address] ?[place], [place] [address] ?  ? [facility]) ?Get Driving Directions ?[phone] ?[address] ?[place], [place] [address] ? ?[facility] [place] ([place]) ?Get Driving Directions ?[address] [place],  [place]  [address] ? ?[facility] ?Get Driving Directions ?[phone] ?[address] ?[place], [place] [address] ?  ?[facility] [place] ?Get Driving Directions  ?[phone] ?[address] ?[address] ?[place], [place] [address] ?  ?[facility] ?Get Driving Directions ?[phone] ?[address] ?[place], [place] [address] ? ?Your MyChart E-visit questionnaire answers were reviewed by a board certified advanced clinical practitioner to complete your personal care plan based on your specific symptoms.  Thank you for using e-Visits. ?  ? ?

## 2021-08-13 NOTE — Progress Notes (Signed)
? ?Virtual Visit Consent  ? ?[place], you are scheduled for a virtual visit with Mary-Margaret Hassell Done, FNP, a [facility] provider, today.   ?  ?Just as with appointments in the office, your consent must be obtained to participate.  Your consent will be active for this visit and any virtual visit you may have with one of our providers in the next 365 days.   ?  ?If you have a MyChart account, a copy of this consent can be sent to you electronically.  All virtual visits are billed to your insurance company just like a traditional visit in the office.   ? ?As this is a virtual visit, video technology does not allow for your provider to perform a traditional examination.  This may limit your provider's ability to fully assess your condition.  If your provider identifies any concerns that need to be evaluated in person or the need to arrange testing (such as labs, EKG, etc.), we will make arrangements to do so.   ?  ?Although advances in technology are sophisticated, we cannot ensure that it will always work on either your end or our end.  If the connection with a video visit is poor, the visit may have to be switched to a telephone visit.  With either a video or telephone visit, we are not always able to ensure that we have a secure connection.    ? ?I need to obtain your verbal consent now.   Are you willing to proceed with your visit today? YES ?  ?Nazyiah Wellman has provided verbal consent on 08/13/2021 for a virtual visit (video or telephone). ?  ?Mary-Margaret Hassell Done, FNP  ? ?Date: 08/13/2021 6:50 PM ? ? ?Virtual Visit via Video Note  ? ?I, Mary-Margaret Hassell Done, connected with Kortni Bodnar ([id number], July 06, 1990) on 08/13/21 at  7:00 PM EDT by a video-enabled telemedicine application and verified that I am speaking with the correct person using two identifiers. ? ?Location: ?Patient: Virtual Visit Location Patient: Home ?Provider: Virtual Visit Location Provider: Mobile ?  ?I discussed the  limitations of evaluation and management by telemedicine and the availability of in person appointments. The patient expressed understanding and agreed to proceed.   ? ?History of Present Illness: ?Sheila Merritt is a 31 y.o. who identifies as a female who was assigned female at birth, and is being seen today for sinusitis ?. ? ?HPI: Patient originally did an evisit and was dx with viral sinusitis and was told needed a face to fac evisit for her blurred vision. According to her questionnaire she just finished amoxicillin an dsteroids and once med s were complete her symptoms returned.she says the blurred vision is coming from her sinusitis. According to her chart review she was given amoxicillin on 07/26/21 and was treated with eye drops for conjunctivitis on 08/05/21. ? ?* connected with patient via video and she was returning something to a store so she disconnected the video. I attmepted to reach back out to patient 2 more times with no response. Appointmentt cancelled ?  ? ? ?Problems:  ?Patient Active Problem List  ? Diagnosis Date Noted  ? History of substance abuse ([place]) 07/06/2021  ? Iron deficiency anemia secondary to inadequate dietary iron intake 07/06/2021  ? Recurrent depression ([place]) 07/06/2021  ? Chronic hepatitis C without hepatic coma ([place]) 06/14/2020  ? Anxiety 09/29/2019  ? History of abnormal cervical Pap smear 12/08/2014  ?  ?Allergies: No Known Allergies ?Medications:  ?Current Outpatient Medications:  ?  escitalopram (  LEXAPRO) 10 MG tablet, Take 1 tablet (10 mg total) by mouth daily., Disp: 30 tablet, Rfl: 3 ?  Fe Bisgly-Succ-C-Thre-B12-FA (IRON-150 PO), Take by mouth., Disp: , Rfl:  ?  fluticasone (FLONASE) 50 MCG/ACT nasal spray, Place 2 sprays into both nostrils daily., Disp: 16 g, Rfl: 6 ?  levocetirizine (XYZAL) 5 MG tablet, Take 1 tablet (5 mg total) by mouth every evening., Disp: 90 tablet, Rfl: 1 ?  tetrahydrozoline (VISINE) 0.05 % ophthalmic solution, Place 2 drops into the left eye 3  (three) times daily., Disp: 15 mL, Rfl: 0 ?  tretinoin (RETIN-A) 0.025 % cream, Apply topically at bedtime., Disp: 45 g, Rfl: 0 ? ? ?Appointment cancelled- no show for appointment ? ? ?Mary-Margaret Hassell Done, FNP ? ?

## 2021-08-13 NOTE — Progress Notes (Signed)
E-Visit for Sinus Problems ? ?We are sorry that you are not feeling well.  Here is how we plan to help! ? ?Based on what you have shared with me it looks like you have sinusitis.  Sinusitis is inflammation and infection in the sinus cavities of the head.  Based on your presentation I believe you most likely have Acute Viral Sinusitis.This is an infection most likely caused by a virus. There is not specific treatment for viral sinusitis other than to help you with the symptoms until the infection runs its course.  You may use an oral decongestant such as Mucinex D or if you have glaucoma or high blood pressure use plain Mucinex. Saline nasal spray help and can safely be used as often as needed for congestion, I have prescribed: Fluticasone nasal spray two sprays in each nostril once a day ? ?Some authorities believe that zinc sprays or the use of Echinacea may shorten the course of your symptoms. ? ?Sinus infections are not as easily transmitted as other respiratory infection, however we still recommend that you avoid close contact with loved ones, especially the very young and elderly.  Remember to wash your hands thoroughly throughout the day as this is the number one way to prevent the spread of infection! ? ?Home Care: ?Only take medications as instructed by your medical team. ?Do not take these medications with alcohol. ?A steam or ultrasonic humidifier can help congestion.  You can place a towel over your head and breathe in the steam from hot water coming from a faucet. ?Avoid close contacts especially the very young and the elderly. ?Cover your mouth when you cough or sneeze. ?Always remember to wash your hands. ? ?Get Help Right Away If: ?You develop worsening fever or sinus pain. ?You develop a severe head ache or visual changes. ?Your symptoms persist after you have completed your treatment plan. ? ?Make sure you ?Understand these instructions. ?Will watch your condition. ?Will get help right away if you  are not doing well or get worse. ? ? ?Thank you for choosing an e-visit. ? ?Your e-visit answers were reviewed by a board certified advanced clinical practitioner to complete your personal care plan. Depending upon the condition, your plan could have included both over the counter or prescription medications. ? ?Please review your pharmacy choice. Make sure the pharmacy is open so you can pick up prescription now. If there is a problem, you may contact your provider through MyChart messaging and have the prescription routed to another pharmacy.  Your safety is important to us. If you have drug allergies check your prescription carefully.  ? ?For the next 24 hours you can use MyChart to ask questions about today's visit, request a non-urgent call back, or ask for a work or school excuse. ?You will get an email in the next two days asking about your experience. I hope that your e-visit has been valuable and will speed your recovery. ? ?5-10 minutes spent reviewing and documenting in chart. ? ?

## 2021-08-14 ENCOUNTER — Other Ambulatory Visit: Payer: Self-pay | Admitting: *Deleted

## 2021-08-14 DIAGNOSIS — F419 Anxiety disorder, unspecified: Secondary | ICD-10-CM

## 2021-08-14 DIAGNOSIS — F339 Major depressive disorder, recurrent, unspecified: Secondary | ICD-10-CM

## 2021-08-14 MED ORDER — ESCITALOPRAM OXALATE 10 MG PO TABS: 10.0000 mg | ORAL_TABLET | Freq: Every day | ORAL | 3 refills | Status: DC

## 2021-08-15 ENCOUNTER — Encounter: Payer: 59 | Admitting: Radiology

## 2021-08-15 DIAGNOSIS — Z0289 Encounter for other administrative examinations: Secondary | ICD-10-CM

## 2021-08-16 ENCOUNTER — Ambulatory Visit: Payer: 59 | Admitting: Family Medicine

## 2021-08-17 ENCOUNTER — Encounter: Payer: 59 | Admitting: Family Medicine

## 2021-08-22 ENCOUNTER — Encounter: Payer: Self-pay | Admitting: Family Medicine

## 2021-08-24 ENCOUNTER — Other Ambulatory Visit (INDEPENDENT_AMBULATORY_CARE_PROVIDER_SITE_OTHER): Payer: 59

## 2021-08-24 ENCOUNTER — Encounter: Payer: Self-pay | Admitting: Gastroenterology

## 2021-08-24 ENCOUNTER — Ambulatory Visit (INDEPENDENT_AMBULATORY_CARE_PROVIDER_SITE_OTHER): Payer: 59 | Admitting: Gastroenterology

## 2021-08-24 VITALS — Ht 60.5 in | Wt 111.2 lb

## 2021-08-24 DIAGNOSIS — K5904 Chronic idiopathic constipation: Secondary | ICD-10-CM

## 2021-08-24 DIAGNOSIS — Z8619 Personal history of other infectious and parasitic diseases: Secondary | ICD-10-CM | POA: Diagnosis not present

## 2021-08-24 DIAGNOSIS — R630 Anorexia: Secondary | ICD-10-CM

## 2021-08-24 DIAGNOSIS — R195 Other fecal abnormalities: Secondary | ICD-10-CM

## 2021-08-24 DIAGNOSIS — R140 Abdominal distension (gaseous): Secondary | ICD-10-CM | POA: Diagnosis not present

## 2021-08-24 DIAGNOSIS — R1030 Lower abdominal pain, unspecified: Secondary | ICD-10-CM | POA: Diagnosis not present

## 2021-08-24 DIAGNOSIS — K921 Melena: Secondary | ICD-10-CM

## 2021-08-24 DIAGNOSIS — K581 Irritable bowel syndrome with constipation: Secondary | ICD-10-CM | POA: Diagnosis not present

## 2021-08-24 DIAGNOSIS — R109 Unspecified abdominal pain: Secondary | ICD-10-CM | POA: Diagnosis not present

## 2021-08-24 LAB — CBC
HCT: 37.7 % (ref 36.0–46.0)
Hemoglobin: 12.9 g/dL (ref 12.0–15.0)
MCHC: 34.4 g/dL (ref 30.0–36.0)
MCV: 90 fl (ref 78.0–100.0)
Platelets: 240 10*3/uL (ref 150.0–400.0)
RBC: 4.18 Mil/uL (ref 3.87–5.11)
RDW: 12.7 % (ref 11.5–15.5)
WBC: 4.9 10*3/uL (ref 4.0–10.5)

## 2021-08-24 LAB — SEDIMENTATION RATE: Sed Rate: 10 mm/hr (ref 0–20)

## 2021-08-24 LAB — C-REACTIVE PROTEIN: CRP: <1 mg/dL (ref 0.5–20.0)

## 2021-08-24 MED ORDER — LINACLOTIDE 145 MCG PO CAPS: 145.0000 ug | ORAL_CAPSULE | Freq: Every day | ORAL | 2 refills | Status: DC

## 2021-08-24 MED ORDER — PLENVU 140 G PO SOLR: 1.0000 | ORAL | 0 refills | Status: DC

## 2021-08-24 NOTE — Progress Notes (Signed)
? ?GASTROENTEROLOGY OUTPATIENT CLINIC VISIT  ? ?Primary Care Provider ?Baruch Gouty, FNP ?[place] [address] ?[phone] ? ?Referring Provider ?Baruch Gouty, FNP ?[address] [place],  [place] [address] ?[phone] ? ?Patient Profile: ?Sheila Merritt is a 31 y.o. 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  The patient presents to the [facility] for an evaluation and management of problem(s) noted below: ? ?Problem List ?1. Irritable bowel syndrome with constipation   ?2. Chronic idiopathic constipation   ?3. Lower abdominal pain   ?4. Bloating   ?5. Abdominal cramping   ?6. Anorexia   ?7. Dark stools   ?8. Hematochezia   ?9. History of parasitic infection   ?10. History of hepatitis C   ? ? ?History of Present Illness ?This is the patient's first visit to the outpatient [place] clinic.  She has previously been evaluated by gastroenterology in [place] where she was initiated and treated for her she lost her fiance within the last year.  She believes she had a CT as a result of intercourse with her husband although she does report a history of previous substance abuse.  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  Patient since she was very young has had issues from a GI standpoint and really been dealing with constipation for years.  She has to take a stool softener with laxative for her to be able to have a bowel movement but over the course of the last few months, she has had progressive issues where that regimen does not help her.  She has previously trialed MiraLAX on a daily basis and had no effectiveness with that.  Patient has begun to note issues of bright red blood per rectum that has occurred as well as mucus.  She believes she has passed parasitic tapeworms in the past and has been treated for parasites with antihelminthic therapy in the past.  We do not have access to any of those records.   She does state however she has had 3 stool studies that were negative for parasitic infections but she and her mother believe that she still has an active tapeworm infection, but are not interested in repeating stool studies specifically for that.  Patient does not take significant nonsteroidals or BC/Goody powders.  She has never had an upper or lower endoscopy.  She has begun to experience more issues of anorexia and had a weight loss last year but has slowly started to regain some of that weight back.  She just wants to feel better and her mother is here to support her for that. ? ?GI Review of Systems ?Positive as above including issues of urgency ?Negative for dysphagia, odynophagia, tenesmus ? ?Review of Systems ?General: Denies fevers/chills ?HEENT: Denies oral lesions ?Cardiovascular: Denies chest pain ?Pulmonary: Denies shortness of breath ?Gastroenterological: See HPI ?Genitourinary: Denies darkened urine ?Hematological: Denies easy bruising/bleeding ?Endocrine: Denies temperature intolerance ?Dermatological: Denies jaundice ?Psychological: Mood is stable ? ? ?Medications ?Current Outpatient Medications  ?Medication Sig Dispense Refill  ? escitalopram (LEXAPRO) 10 MG tablet Take 1 tablet (10 mg total) by mouth daily. 30 tablet 3  ? Fe Bisgly-Succ-C-Thre-B12-FA (IRON-150 PO) Take by mouth every 30 (thirty) days.    ? levocetirizine (XYZAL) 5 MG tablet Take 1 tablet (5 mg total) by mouth every evening. 90 tablet 1  ? linaclotide (LINZESS) 145 MCG CAPS capsule Take 1 capsule (145 mcg total) by mouth daily before  breakfast. 30 capsule 2  ? Oxymetazoline HCl (NASAL SPRAY NA) Place into the nose 2 (two) times daily.    ? PEG-KCl-NaCl-NaSulf-Na Asc-C (PLENVU) 140 g SOLR Take 1 kit by mouth as directed. 1 each 0  ? tretinoin (RETIN-A) 0.025 % cream Apply topically at bedtime. 45 g 0  ? ?No current facility-administered medications for this visit.  ? ? ?Allergies ?No Known Allergies ? ?Histories ?Past Medical  History:  ?Diagnosis Date  ? Abnormal Pap smear   ? Anemia   ? BV (bacterial vaginosis)   ? Hepatitis C   ? History of abnormal cervical Pap smear 12/08/2014  ? Irritable bowel syndrome   ? UTI (lower urinary tract infection)   ? Vaginal Pap smear, abnormal   ? ?Past Surgical History:  ?Procedure Laterality Date  ? CERVICAL CONIZATION W/BX N/A 01/27/2015  ? Procedure: LASER CONIZATION OF CERVIX WITH BIOPSY;  Surgeon: Florian Buff, MD;  Location: [facility];  Service: Gynecology;  Laterality: N/A;  ? HOLMIUM LASER APPLICATION N/A 5/91/6384  ? Procedure: HOLMIUM LASER APPLICATION;  Surgeon: Florian Buff, MD;  Location: [facility];  Service: Gynecology;  Laterality: N/A;  ? NO PAST SURGERIES    ? ?Social History  ? ?Socioeconomic History  ? Marital status: Single  ?  Spouse name: Not on file  ? Number of children: 0  ? Years of education: Not on file  ? Highest education level: Not on file  ?Occupational History  ? Not on file  ?Tobacco Use  ? Smoking status: Never  ? Smokeless tobacco: Never  ?Vaping Use  ? Vaping Use: Never used  ?Substance and Sexual Activity  ? Alcohol use: Yes  ?  Comment: occ 1 beer a month  ? Drug use: No  ? Sexual activity: Yes  ?  Birth control/protection: Condom  ?Other Topics Concern  ? Not on file  ?Social History Narrative  ? Not on file  ? ?Social Determinants of Health  ? ?Financial Resource Strain: Not on file  ?Food Insecurity: Not on file  ?Transportation Needs: Not on file  ?Physical Activity: Not on file  ?Stress: Not on file  ?Social Connections: Not on file  ?Intimate Partner Violence: Not on file  ? ?Family History  ?Problem Relation Age of Onset  ? Diabetes Mother   ?     borderline  ? Hyperlipidemia Mother   ? Heart disease Father   ? Heart attack Father   ?     had heart surgery  ? Diabetes Father   ? Hyperlipidemia Father   ? Diabetes Brother   ?     borderline  ? Other Brother   ?     killed in [place]  ? Thyroid disease Maternal Grandmother   ? Hypertension Maternal Grandmother   ?  Heart disease Maternal Grandfather 32  ? Heart attack Maternal Grandfather   ? Diabetes Paternal Grandmother   ? Heart disease Paternal Grandfather   ? Heart attack Paternal Grandfather   ? Colon cancer Maternal Great-grandmother   ? Breast cancer Maternal Great-grandmother   ? Esophageal cancer Neg Hx   ? Inflammatory bowel disease Neg Hx   ? Irritable bowel syndrome Neg Hx   ? Liver disease Neg Hx   ? Pancreatic cancer Neg Hx   ? Rectal cancer Neg Hx   ? Stomach cancer Neg Hx   ? ?I have reviewed her medical, social, and family history in detail and updated the electronic medical record as necessary.  ? ? ?  PHYSICAL EXAMINATION  ?Ht 5' 0.5" (1.537 m) Comment: height measured without shoes  Wt 111 lb 4 oz (50.5 kg)   LMP 04/11/2021   BMI 21.37 kg/m?  ?Wt Readings from Last 3 Encounters:  ?08/24/21 111 lb 4 oz (50.5 kg)  ?07/06/21 109 lb (49.4 kg)  ?09/12/17 105 lb (47.6 kg)  ?GEN: NAD, appears older than stated age, doesn't appear chronically ill, accompanied by mother ?PSYCH: Cooperative, without pressured speech ?EYE: Conjunctivae pink, sclerae anicteric ?ENT: MMM ?CV: RR without R/Gs  ?RESP: CTAB posteriorly, without wheezing ?GI: NABS, soft, lower abdomen distention and protuberance, no hepatosplenomegaly appreciated  ?GU: DRE deferred by patient as she agrees to colonoscopic evaluation in the coming weeks ?MSK/EXT: No lower extremity edema, no palmar erythema ?SKIN: No jaundice, no spider angiomata ?NEURO:  Alert & Oriented x 3, no focal deficits, no evidence of asterixis ? ? ?REVIEW OF DATA  ?I reviewed the following data at the time of this encounter: ? ?GI Procedures and Studies  ?No previous GI procedures performed ? ?Laboratory Studies  ?Reviewed those in epic and care everywhere ? ?Imaging Studies  ?No imaging studies to review ? ? ?ASSESSMENT  ?Ms. Cavness is a 31 y.o. 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  The patient is seen today for evaluation and  management of: ? ?1. Irritable bowel syndrome with constipation   ?2. Chronic idiopathic constipation   ?3. Lower abdominal pain   ?4. Bloating   ?5. Abdominal cramping   ?6. Anorexia   ?7. Dark stools   ?8. Hematochez

## 2021-08-24 NOTE — Patient Instructions (Addendum)
We have sent the following medications to your pharmacy for you to pick up at your convenience: ?Linzess, Plenvu ? ?Linzess - Take 1 capsule by mouth once daily. Samples given in office today.  ? ?Toileting tips to help with your constipation ?- Drink at least 64-80 ounces of water/liquid per day. ?- Establish a time to try to move your bowels every day.  For many people, this is after a cup of coffee or after a meal such as breakfast. ?- Sit all of the way back on the toilet keeping your back fairly straight and while sitting up, try to rest the tops of your forearms on your upper thighs.   ?- Raising your feet with a step stool/squatty potty can be helpful to improve the angle that allows your stool to pass through the rectum. ?- Relax the rectum feeling it bulge toward the toilet water.  If you feel your rectum raising toward your body, you are contracting rather than relaxing. ?- Breathe in and slowly exhale. "Belly breath" by expanding your belly towards your belly button. Keep belly expanded as you gently direct pressure down and back to the anus.  A low pitched GRRR sound can assist with increasing intra-abdominal pressure.  ?- Repeat 3-4 times. If unsuccessful, contract the pelvic floor to restore normal tone and get off the toilet.  Avoid excessive straining. ?- To reduce excessive wiping by teaching your anus to normally contract, place hands on outer aspect of knees and resist knee movement outward.  Hold 5-10 second then place hands just inside of knees and resist inward movement of knees.  Hold 5 seconds.  Repeat a few times each way. ? ?Your provider has requested that you go to the basement level for lab work before leaving today. Press "B" on the elevator. The lab is located at the first door on the left as you exit the elevator. ? ?You have been scheduled for an endoscopy and colonoscopy. Please follow the written instructions given to you at your visit today. ?Please pick up your prep  supplies at the pharmacy within the next 1-3 days. ?If you use inhalers (even only as needed), please bring them with you on the day of your procedure. ? ?Thank you for choosing me and [facility]. ? ?Dr. Meridee Score ? ? ?

## 2021-08-26 ENCOUNTER — Encounter: Payer: Self-pay | Admitting: Gastroenterology

## 2021-08-26 DIAGNOSIS — K5904 Chronic idiopathic constipation: Secondary | ICD-10-CM | POA: Insufficient documentation

## 2021-08-26 DIAGNOSIS — R195 Other fecal abnormalities: Secondary | ICD-10-CM | POA: Insufficient documentation

## 2021-08-26 DIAGNOSIS — K921 Melena: Secondary | ICD-10-CM | POA: Insufficient documentation

## 2021-08-26 DIAGNOSIS — Z8619 Personal history of other infectious and parasitic diseases: Secondary | ICD-10-CM | POA: Insufficient documentation

## 2021-08-26 DIAGNOSIS — K581 Irritable bowel syndrome with constipation: Secondary | ICD-10-CM | POA: Insufficient documentation

## 2021-08-26 DIAGNOSIS — R109 Unspecified abdominal pain: Secondary | ICD-10-CM | POA: Insufficient documentation

## 2021-08-26 DIAGNOSIS — R140 Abdominal distension (gaseous): Secondary | ICD-10-CM | POA: Insufficient documentation

## 2021-08-26 DIAGNOSIS — R1030 Lower abdominal pain, unspecified: Secondary | ICD-10-CM | POA: Insufficient documentation

## 2021-08-26 DIAGNOSIS — R630 Anorexia: Secondary | ICD-10-CM | POA: Insufficient documentation

## 2021-08-26 LAB — HCV RNA QUANT: Hepatitis C Quantitation: NOT DETECTED IU/mL

## 2021-08-27 ENCOUNTER — Telehealth: Payer: Self-pay

## 2021-08-27 DIAGNOSIS — Z8619 Personal history of other infectious and parasitic diseases: Secondary | ICD-10-CM

## 2021-08-27 LAB — HEPATITIS B SURFACE ANTIGEN: Hepatitis B Surface Ag: NONREACTIVE

## 2021-08-27 LAB — HEPATITIS A ANTIBODY, TOTAL: Hepatitis A AB,Total: REACTIVE — AB

## 2021-08-27 LAB — HEPATITIS B SURFACE ANTIBODY,QUALITATIVE: Hep B S Ab: REACTIVE — AB

## 2021-08-27 LAB — HEPATITIS B CORE ANTIBODY, IGM: Hep B C IgM: NONREACTIVE

## 2021-08-27 NOTE — Telephone Encounter (Signed)
-----   Message from Lemar Lofty., MD sent at 08/26/2021 11:33 PM EDT ----- ?Regarding: Follow-up on labs ?Sheila Merritt, ?Please see if we can add on a hepatitis B core antibody total to the labs that were drawn if not she can have those done at some point in the future. ?Thanks. ?GM ? ?

## 2021-08-27 NOTE — Telephone Encounter (Signed)
The pt has been advised and lab order entered.  She will come in the next few weeks to have this done.   ?

## 2021-08-31 ENCOUNTER — Encounter: Payer: Self-pay | Admitting: Gastroenterology

## 2021-09-13 ENCOUNTER — Other Ambulatory Visit: Payer: Self-pay

## 2021-09-13 MED ORDER — PLENVU 140 G PO SOLR
1.0000 | ORAL | 0 refills | Status: DC
Start: 2021-09-13 — End: 2021-10-25

## 2021-09-13 MED ORDER — LINACLOTIDE 145 MCG PO CAPS
145.0000 ug | ORAL_CAPSULE | Freq: Every day | ORAL | 2 refills | Status: DC
Start: 2021-09-13 — End: 2021-10-25

## 2021-09-13 NOTE — Progress Notes (Signed)
Prescriptions have been re-sent to pharmacy. Pt has been informed via my chart.

## 2021-09-20 ENCOUNTER — Encounter: Payer: 59 | Admitting: Gastroenterology

## 2021-09-20 ENCOUNTER — Telehealth: Payer: Self-pay | Admitting: Gastroenterology

## 2021-09-20 NOTE — Telephone Encounter (Signed)
No late cancellation fee for this first missed procedure visit.  Please place recall for 4 months from now.  If she reschedules the procedure and misses another procedure visit she will need to be assessed the late cancellation fee. Thanks. GM

## 2021-09-20 NOTE — Telephone Encounter (Signed)
Called pt at 2:27- mailbox full. Unable to leave a message.

## 2021-10-07 ENCOUNTER — Telehealth: Payer: 59 | Admitting: Family

## 2021-10-07 DIAGNOSIS — A749 Chlamydial infection, unspecified: Secondary | ICD-10-CM | POA: Diagnosis not present

## 2021-10-07 DIAGNOSIS — Z202 Contact with and (suspected) exposure to infections with a predominantly sexual mode of transmission: Secondary | ICD-10-CM

## 2021-10-07 DIAGNOSIS — R69 Illness, unspecified: Secondary | ICD-10-CM | POA: Diagnosis not present

## 2021-10-07 MED ORDER — DOXYCYCLINE HYCLATE 100 MG PO TABS: 100.0000 mg | ORAL_TABLET | Freq: Two times a day (BID) | ORAL | 0 refills | Status: DC

## 2021-10-07 NOTE — Progress Notes (Signed)
E-Visit for Vaginal Symptoms  We are sorry that you are not feeling well. Here is how we plan to help! Based on what you shared with me it looks like you: May have a vaginosis due to bacteria  Vaginosis is an inflammation of the vagina that can result in discharge, itching and pain. The cause is usually a change in the normal balance of vaginal bacteria or an infection. Vaginosis can also result from reduced estrogen levels after menopause.  The most common causes of vaginosis are:   Bacterial vaginosis which results from an overgrowth of one on several organisms that are normally present in your vagina.   Yeast infections which are caused by a naturally occurring fungus called candida.   Vaginal atrophy (atrophic vaginosis) which results from the thinning of the vagina from reduced estrogen levels after menopause.   Trichomoniasis which is caused by a parasite and is commonly transmitted by sexual intercourse.  Factors that increase your risk of developing vaginosis include: Medications, such as antibiotics and steroids Uncontrolled diabetes Use of hygiene products such as bubble bath, vaginal spray or vaginal deodorant Douching Wearing damp or tight-fitting clothing Using an intrauterine device (IUD) for birth control Hormonal changes, such as those associated with pregnancy, birth control pills or menopause Sexual activity Having a sexually transmitted infection  Your treatment plan is I have sent in doxycycline 100 mg that you will take twice a day for 7 days. It is very important you do not have sex until you complete this antibiotic. You need to be seen face to face this week and be tested for other STI's and then followed up in 3 months to be retested.    Be sure to take all of the medication as directed. Stop taking any medication if you develop a rash, tongue swelling or shortness of breath. Mothers who are breast feeding should consider pumping and discarding their breast milk  while on these antibiotics. However, there is no consensus that infant exposure at these doses would be harmful.  Remember that medication creams can weaken latex condoms. Marland Kitchen   HOME CARE:  Good hygiene may prevent some types of vaginosis from recurring and may relieve some symptoms:  Avoid baths, hot tubs and whirlpool spas. Rinse soap from your outer genital area after a shower, and dry the area well to prevent irritation. Don't use scented or harsh soaps, such as those with deodorant or antibacterial action. Avoid irritants. These include scented tampons and pads. Wipe from front to back after using the toilet. Doing so avoids spreading fecal bacteria to your vagina.  Other things that may help prevent vaginosis include:  Don't douche. Your vagina doesn't require cleansing other than normal bathing. Repetitive douching disrupts the normal organisms that reside in the vagina and can actually increase your risk of vaginal infection. Douching won't clear up a vaginal infection. Use a latex condom. Both female and female latex condoms may help you avoid infections spread by sexual contact. Wear cotton underwear. Also wear pantyhose with a cotton crotch. If you feel comfortable without it, skip wearing underwear to bed. Yeast thrives in [employer] Your symptoms should improve in the next day or two.  GET HELP RIGHT AWAY IF:  You have pain in your lower abdomen ( pelvic area or over your ovaries) You develop nausea or vomiting You develop a fever Your discharge changes or worsens You have persistent pain with intercourse You develop shortness of breath, a rapid pulse, or you faint.  These  symptoms could be signs of problems or infections that need to be evaluated by a medical provider now.  MAKE SURE YOU   Understand these instructions. Will watch your condition. Will get help right away if you are not doing well or get worse.  Thank you for choosing an e-visit.  Your e-visit  answers were reviewed by a board certified advanced clinical practitioner to complete your personal care plan. Depending upon the condition, your plan could have included both over the counter or prescription medications.  Please review your pharmacy choice. Make sure the pharmacy is open so you can pick up prescription now. If there is a problem, you may contact your provider through [employer] and have the prescription routed to another pharmacy.  Your safety is important to [place]. If you have drug allergies check your prescription carefully.   For the next 24 hours you can use MyChart to ask questions about today's visit, request a non-urgent call back, or ask for a work or school excuse. You will get an email in the next two days asking about your experience. I hope that your e-visit has been valuable and will speed your recovery.  Approximately 5 minutes was spent documenting and reviewing patient's chart.

## 2021-10-07 NOTE — Addendum Note (Signed)
Addended by: Jannifer Rodney A on: 10/07/2021 05:09 PM   Modules accepted: Orders

## 2021-10-12 ENCOUNTER — Telehealth: Payer: 59 | Admitting: Physician Assistant

## 2021-10-12 DIAGNOSIS — R0981 Nasal congestion: Secondary | ICD-10-CM

## 2021-10-12 MED ORDER — FLUTICASONE PROPIONATE 50 MCG/ACT NA SUSP: 2.0000 | Freq: Every day | NASAL | 0 refills | Status: DC

## 2021-10-12 NOTE — Progress Notes (Signed)
E-Visit for Sinus Problems  We are sorry that you are not feeling well.  Here is how we plan to help!  Based on what you have shared with me it looks like you have sinusitis.  Sinusitis is inflammation and infection in the sinus cavities of the head.  Based on your presentation I believe you most likely have Acute Viral Sinusitis.This is an infection most likely caused by a virus. There is not specific treatment for viral sinusitis other than to help you with the symptoms until the infection runs its course.  You may use an oral decongestant such as Mucinex D or if you have glaucoma or high blood pressure use plain Mucinex. Saline nasal spray help and can safely be used as often as needed for congestion, I have prescribed: Fluticasone nasal spray two sprays in each nostril once a day  Some authorities believe that zinc sprays or the use of Echinacea may shorten the course of your symptoms.  Sinus infections are not as easily transmitted as other respiratory infection, however we still recommend that you avoid close contact with loved ones, especially the very young and elderly.  Remember to wash your hands thoroughly throughout the day as this is the number one way to prevent the spread of infection!  Home Care: Only take medications as instructed by your medical team. Do not take these medications with alcohol. A steam or ultrasonic humidifier can help congestion.  You can place a towel over your head and breathe in the steam from hot water coming from a faucet. Avoid close contacts especially the very young and the elderly. Cover your mouth when you cough or sneeze. Always remember to wash your hands.  Get Help Right Away If: You develop worsening fever or sinus pain. You develop a severe head ache or visual changes. Your symptoms persist after you have completed your treatment plan.  Make sure you Understand these instructions. Will watch your condition. Will get help right away if you  are not doing well or get worse.   Thank you for choosing an e-visit.  Your e-visit answers were reviewed by a board certified advanced clinical practitioner to complete your personal care plan. Depending upon the condition, your plan could have included both over the counter or prescription medications.  Please review your pharmacy choice. Make sure the pharmacy is open so you can pick up prescription now. If there is a problem, you may contact your provider through MyChart messaging and have the prescription routed to another pharmacy.  Your safety is important to us. If you have drug allergies check your prescription carefully.   For the next 24 hours you can use MyChart to ask questions about today's visit, request a non-urgent call back, or ask for a work or school excuse. You will get an email in the next two days asking about your experience. I hope that your e-visit has been valuable and will speed your recovery.  I provided 5 minutes of non face-to-face time during this encounter for chart review and documentation.   

## 2021-10-18 ENCOUNTER — Encounter: Payer: 59 | Admitting: Radiology

## 2021-10-22 ENCOUNTER — Encounter: Payer: 59 | Admitting: Radiology

## 2021-10-24 ENCOUNTER — Encounter: Payer: 59 | Admitting: Radiology

## 2021-10-25 ENCOUNTER — Encounter: Payer: Self-pay | Admitting: Radiology

## 2021-10-25 ENCOUNTER — Ambulatory Visit (INDEPENDENT_AMBULATORY_CARE_PROVIDER_SITE_OTHER): Payer: 59 | Admitting: Radiology

## 2021-10-25 VITALS — BP 98/60 | Ht 65.5 in | Wt 111.0 lb

## 2021-10-25 DIAGNOSIS — N898 Other specified noninflammatory disorders of vagina: Secondary | ICD-10-CM

## 2021-10-25 DIAGNOSIS — R69 Illness, unspecified: Secondary | ICD-10-CM | POA: Diagnosis not present

## 2021-10-25 DIAGNOSIS — Z202 Contact with and (suspected) exposure to infections with a predominantly sexual mode of transmission: Secondary | ICD-10-CM

## 2021-10-25 LAB — WET PREP FOR TRICH, YEAST, CLUE

## 2021-10-25 NOTE — Progress Notes (Signed)
      Subjective: Sheila Merritt is a 31 y.o. female who complains of exposure to chlamydia last month. She was treated with doxy, finished course. C/o vaginal odor. Would like STI screen for gc/trich. N period x 8 months, could not leave a urine sample. Hx of cold knife cone 2016, has not had a pap since. Declines pap or AEX today. Only wants to address the STI screening.    Review of Systems  All other systems reviewed and are negative.    Objective:  -Vulva: without lesions or discharge -Vagina: discharge present, aptima swab and wet prep obtained -Cervix: no lesion or discharge, no CMT -Perineum: no lesions -Uterus: Mobile, non tender -Adnexa: no masses or tenderness   Microscopic wet-mount exam shows negative for pathogens, normal epithelial cells.   Chaperone offered and declined.  Assessment:/Plan:  1. Chlamydia contact, treated  - SURESWAB CT/NG/T. vaginalis  2. Vaginal odor Reassured negative wet prep - WET PREP FOR TRICH, YEAST, CLUE    Follow up 4 weeks for AEX and pap. Discussed the importance of this testing and a full STI screen with blood work and testing for pregnancy, cause for amenorrhea with potential treatment. Will contact patient with results of testing completed today. Avoid intercourse until symptoms are resolved. Safe sex encouraged. Avoid the use of soaps or perfumed products in the peri area. Avoid tub baths and sitting in sweaty or wet clothing for prolonged periods of time.

## 2021-10-27 LAB — SURESWAB CT/NG/T. VAGINALIS
C. trachomatis RNA, TMA: NOT DETECTED
N. gonorrhoeae RNA, TMA: NOT DETECTED
Trichomonas vaginalis RNA: NOT DETECTED

## 2021-10-29 ENCOUNTER — Telehealth: Payer: 59 | Admitting: Physician Assistant

## 2021-10-29 DIAGNOSIS — R21 Rash and other nonspecific skin eruption: Secondary | ICD-10-CM | POA: Diagnosis not present

## 2021-10-29 MED ORDER — TRIAMCINOLONE ACETONIDE 0.1 % EX CREA: 1.0000 | TOPICAL_CREAM | Freq: Two times a day (BID) | CUTANEOUS | 0 refills | Status: DC

## 2021-10-29 NOTE — Progress Notes (Signed)
E Visit for Rash  We are sorry that you are not feeling well. Here is how we plan to help!  Based on what you shared with me it looks like you have contact dermatitis.  Contact dermatitis is a skin rash caused by something that touches the skin and causes irritation or inflammation.  Your skin may be red, swollen, dry, cracked, and itch.  The rash should go away in a few days but can last a few weeks.  If you get a rash, it's important to figure out what caused it so the irritant can be avoided in the future.  I have prescribed Triamcinolone cream to apply to affected areas twice daily.  HOME CARE:  Take cool showers and avoid direct sunlight. Apply cool compress or wet dressings. Take a bath in an oatmeal bath.  Sprinkle content of one Aveeno packet under running faucet with comfortably warm water.  Bathe for 15-20 minutes, 1-2 times daily.  Pat dry with a towel. Do not rub the rash. Use hydrocortisone cream. Take an antihistamine like Benadryl for widespread rashes that itch.  The adult dose of Benadryl is 25-50 mg by mouth 4 times daily. Caution:  This type of medication may cause sleepiness.  Do not drink alcohol, drive, or operate dangerous machinery while taking antihistamines.  Do not take these medications if you have prostate enlargement.  Read package instructions thoroughly on all medications that you take.  GET HELP RIGHT AWAY IF:  Symptoms don't go away after treatment. Severe itching that persists. If you rash spreads or swells. If you rash begins to smell. If it blisters and opens or develops a yellow-brown crust. You develop a fever. You have a sore throat. You become short of breath.  MAKE SURE YOU:  Understand these instructions. Will watch your condition. Will get help right away if you are not doing well or get worse.  Thank you for choosing an e-visit.  Your e-visit answers were reviewed by a board certified advanced clinical practitioner to complete your  personal care plan. Depending upon the condition, your plan could have included both over the counter or prescription medications.  Please review your pharmacy choice. Make sure the pharmacy is open so you can pick up prescription now. If there is a problem, you may contact your provider through [employer] and have the prescription routed to another pharmacy.  Your safety is important to [place]. If you have drug allergies check your prescription carefully.   For the next 24 hours you can use MyChart to ask questions about today's visit, request a non-urgent call back, or ask for a work or school excuse. You will get an email in the next two days asking about your experience. I hope that your e-visit has been valuable and will speed your recovery.  I provided 5 minutes of non face-to-face time during this encounter for chart review and documentation.

## 2021-10-31 ENCOUNTER — Other Ambulatory Visit: Payer: 59

## 2021-11-19 ENCOUNTER — Encounter: Payer: Self-pay | Admitting: Gastroenterology

## 2021-11-20 ENCOUNTER — Encounter: Payer: Self-pay | Admitting: Family Medicine

## 2021-11-20 NOTE — Telephone Encounter (Signed)
AEX scheduled 11/22/21.

## 2021-11-20 NOTE — Telephone Encounter (Signed)
Labs would have to be ordered for cycle day 3, if annual is not on that day will need to return

## 2021-11-22 ENCOUNTER — Ambulatory Visit: Payer: Self-pay | Admitting: Radiology

## 2021-11-22 DIAGNOSIS — Z0289 Encounter for other administrative examinations: Secondary | ICD-10-CM

## 2021-12-03 ENCOUNTER — Telehealth: Payer: 59 | Admitting: Physician Assistant

## 2021-12-03 DIAGNOSIS — R21 Rash and other nonspecific skin eruption: Secondary | ICD-10-CM | POA: Diagnosis not present

## 2021-12-03 MED ORDER — PREDNISONE 10 MG PO TABS: ORAL_TABLET | ORAL | 0 refills | Status: DC

## 2021-12-03 MED ORDER — MUPIROCIN 2 % EX OINT: 1.0000 | TOPICAL_OINTMENT | Freq: Two times a day (BID) | CUTANEOUS | 0 refills | Status: DC

## 2021-12-03 NOTE — Progress Notes (Signed)
E Visit for Rash  We are sorry that you are not feeling well. Here is how we plan to help!  I am prescribing a two week course of steroids (37 tablets of 10 mg prednisone).  Days 1-4 take 4 tablets (40 mg) daily  Days 5-8 take 3 tablets (30 mg) daily, Days 9-11 take 2 tablets (20 mg) daily, Days 12-14 take 1 tablet (10 mg) daily.   Based upon what you have shared with me it looks like you have a bacterial follicultits.  Folliculitis is inflammation of the hair follicles that can be caused by a superficial infection of the skin and is treated with an antibiotic. I have prescribed: and Topical mupiricin   HOME CARE:  Take cool showers and avoid direct sunlight. Apply cool compress or wet dressings. Take a bath in an oatmeal bath.  Sprinkle content of one Aveeno packet under running faucet with comfortably warm water.  Bathe for 15-20 minutes, 1-2 times daily.  Pat dry with a towel. Do not rub the rash. Use hydrocortisone cream. Take an antihistamine like Benadryl for widespread rashes that itch.  The adult dose of Benadryl is 25-50 mg by mouth 4 times daily. Caution:  This type of medication may cause sleepiness.  Do not drink alcohol, drive, or operate dangerous machinery while taking antihistamines.  Do not take these medications if you have prostate enlargement.  Read package instructions thoroughly on all medications that you take.  GET HELP RIGHT AWAY IF:  Symptoms don't go away after treatment. Severe itching that persists. If you rash spreads or swells. If you rash begins to smell. If it blisters and opens or develops a yellow-brown crust. You develop a fever. You have a sore throat. You become short of breath.  MAKE SURE YOU:  Understand these instructions. Will watch your condition. Will get help right away if you are not doing well or get worse.  Thank you for choosing an e-visit.  Your e-visit answers were reviewed by a board certified advanced clinical practitioner to  complete your personal care plan. Depending upon the condition, your plan could have included both over the counter or prescription medications.  Please review your pharmacy choice. Make sure the pharmacy is open so you can pick up prescription now. If there is a problem, you may contact your provider through [employer] and have the prescription routed to another pharmacy.  Your safety is important to [place]. If you have drug allergies check your prescription carefully.   For the next 24 hours you can use MyChart to ask questions about today's visit, request a non-urgent call back, or ask for a work or school excuse. You will get an email in the next two days asking about your experience. I hope that your e-visit has been valuable and will speed your recovery.  I provided 5 minutes of non face-to-face time during this encounter for chart review and documentation.

## 2021-12-12 ENCOUNTER — Encounter: Payer: Self-pay | Admitting: Family Medicine

## 2021-12-13 ENCOUNTER — Ambulatory Visit: Payer: 59 | Admitting: Family Medicine

## 2021-12-18 ENCOUNTER — Ambulatory Visit: Payer: 59 | Admitting: Family Medicine

## 2022-01-09 ENCOUNTER — Telehealth: Payer: 59

## 2022-01-12 ENCOUNTER — Encounter: Payer: Self-pay | Admitting: Family Medicine

## 2022-01-13 ENCOUNTER — Other Ambulatory Visit: Payer: Self-pay

## 2022-01-17 ENCOUNTER — Ambulatory Visit: Payer: 59 | Admitting: Podiatry

## 2022-01-23 ENCOUNTER — Telehealth: Payer: 59 | Admitting: Physician Assistant

## 2022-01-23 DIAGNOSIS — B889 Infestation, unspecified: Secondary | ICD-10-CM

## 2022-01-23 DIAGNOSIS — H00015 Hordeolum externum left lower eyelid: Secondary | ICD-10-CM | POA: Diagnosis not present

## 2022-01-23 DIAGNOSIS — J019 Acute sinusitis, unspecified: Secondary | ICD-10-CM | POA: Diagnosis not present

## 2022-01-23 DIAGNOSIS — B9689 Other specified bacterial agents as the cause of diseases classified elsewhere: Secondary | ICD-10-CM | POA: Diagnosis not present

## 2022-01-23 DIAGNOSIS — L03317 Cellulitis of buttock: Secondary | ICD-10-CM

## 2022-01-23 MED ORDER — FLUCONAZOLE 150 MG PO TABS: ORAL_TABLET | ORAL | 0 refills | Status: DC

## 2022-01-23 MED ORDER — PERMETHRIN 5 % EX CREA: 1.0000 | TOPICAL_CREAM | Freq: Once | CUTANEOUS | 0 refills | Status: AC

## 2022-01-23 MED ORDER — DOXYCYCLINE HYCLATE 100 MG PO TABS: 100.0000 mg | ORAL_TABLET | Freq: Two times a day (BID) | ORAL | 0 refills | Status: DC

## 2022-01-23 NOTE — Patient Instructions (Signed)
  Burns [place], thank you for joining Leeanne Rio, PA-C for today's virtual visit.  While this provider is not your primary care provider (PCP), if your PCP is located in our provider database this encounter information will be shared with them immediately following your visit.  Consent: (Patient) Sheila Merritt provided verbal consent for this virtual visit at the beginning of the encounter.  Current Medications:  Current Outpatient Medications:    amoxicillin (AMOXIL) 875 MG tablet, SMARTSIG:1 Tablet(s) By Mouth Every 12 Hours, Disp: , Rfl:    fluticasone (FLONASE) 50 MCG/ACT nasal spray, Place 2 sprays into both nostrils daily., Disp: 16 g, Rfl: 0   LINZESS 145 MCG CAPS capsule, Take 145 mcg by mouth every morning., Disp: , Rfl:    mupirocin ointment (BACTROBAN) 2 %, Apply 1 Application topically 2 (two) times daily., Disp: 22 g, Rfl: 0   predniSONE (DELTASONE) 10 MG tablet, Days 1-4 take 4 tablets (40 mg) daily  Days 5-8 take 3 tablets (30 mg) daily, Days 9-11 take 2 tablets (20 mg) daily, Days 12-14 take 1 tablet (10 mg) daily., Disp: 37 tablet, Rfl: 0   tretinoin (RETIN-A) 0.025 % cream, Apply topically at bedtime., Disp: 45 g, Rfl: 0   triamcinolone cream (KENALOG) 0.1 %, Apply 1 Application topically 2 (two) times daily., Disp: 30 g, Rfl: 0   Medications ordered in this encounter:  No orders of the defined types were placed in this encounter.    *If you need refills on other medications prior to your next appointment, please contact your pharmacy*  Follow-Up: Call back or seek an in-person evaluation if the symptoms worsen or if the condition fails to improve as anticipated.  [place] [phone]  Other Instructions Regarding the mite infestation, you need to wash her linens as directed before reuse.  You need to keep the dog out of the bed at night, especially until they are given a treatment/bath.  You can apply the Elimite cream as directed at  night, washing off first thing in the morning.  Can use OTC antihistamine for any areas that are still mildly itchy.  For the sinus infection, please keep well-hydrated and get plenty of rest. You can use OTC Tylenol Cold and sinus.  Consider nasal steroid spray like Flonase or Nasacort.  Take the doxycycline as directed.  This will also treat the cellulitis of your gluteal region from the mite bite.  For the stye, keep your hands washed and avoid rubbing the eyelids.  Apply warm compresses for 10 minutes a few times daily over the next couple of weeks as discussed.  If not resolving, you may want to reach out to your primary care provider for referral to an eye specialist.   If you have been instructed to have an in-person evaluation today at a local Urgent Care facility, please use the link below. It will take you to a list of all of our available [facility], including address, phone number and hours of operation. Please do not delay care.  [facility]  If you or a family member do not have a primary care provider, use the link below to schedule a visit and establish care. When you choose a [facility] primary care physician or advanced practice provider, you gain a long-term partner in health. Find a Primary Care Provider  Learn more about [facility]'s in-office and virtual care options: [facility]

## 2022-01-23 NOTE — Progress Notes (Signed)
Virtual Visit Consent   Sheila Merritt, you are scheduled for a virtual visit with a [facility] provider today. Just as with appointments in the office, your consent must be obtained to participate. Your consent will be active for this visit and any virtual visit you may have with one of our providers in the next 365 days. If you have a MyChart account, a copy of this consent can be sent to you electronically.  As this is a virtual visit, video technology does not allow for your provider to perform a traditional examination. This may limit your provider's ability to fully assess your condition. If your provider identifies any concerns that need to be evaluated in person or the need to arrange testing (such as labs, EKG, etc.), we will make arrangements to do so. Although advances in technology are sophisticated, we cannot ensure that it will always work on either your end or our end. If the connection with a video visit is poor, the visit may have to be switched to a telephone visit. With either a video or telephone visit, we are not always able to ensure that we have a secure connection.  By engaging in this virtual visit, you consent to the provision of healthcare and authorize for your insurance to be billed (if applicable) for the services provided during this visit. Depending on your insurance coverage, you may receive a charge related to this service.  I need to obtain your verbal consent now. Are you willing to proceed with your visit today? [name] has provided verbal consent on 01/23/2022 for a virtual visit (video or telephone). Sheila Merritt, [place]  Date: 01/23/2022 7:29 PM  Virtual Visit via Video Note   I, Sheila Merritt, connected with  Tyonna Talerico  ([id number], [birth year]) on 01/23/22 at  7:00 PM EDT by a video-enabled telemedicine application and verified that I am speaking with the correct person using two identifiers.  Location: Patient: Virtual Visit  Location Patient: Home Provider: Virtual Visit Location Provider: Home Office   I discussed the limitations of evaluation and management by telemedicine and the availability of in person appointments. The patient expressed understanding and agreed to proceed.    History of Present Illness: Sheila Merritt is a 31 y.o. who identifies as a female who was assigned female at birth, and is being seen today for multiple complaints.   Patient endorses noting scattered "bites" over her body over the past couple of weeks.  Was initially concerned possible fleabites as her dog sleeps in the bed with her, but notes they seem more like chigger bites or something else.  Were initially itchy.  Itching has improved but the areas have not fully healed.  Occasionally will note new lesions.  Notes a lesion of her left buttock that has gotten slightly swollen and erythematous with occasional drainage.  Denies fever or chills.  Patient also noting 2 to 3 weeks of nasal congestion with sinus pressure and sinus discomfort.  Thought initially related to weather change but has persisted.  Some upper tooth pain and sinus pain.  Has history of sinusitis and this feels similar.  Patient also noting a stye of her R upper eyelid.  States she gets these on occasion and usually they will be tender.  This time notes no tenderness but the area has not resolved.   HPI: HPI  Problems:  Patient Active Problem List   Diagnosis Date Noted   History of hepatitis C 08/26/2021   History of parasitic  infection 08/26/2021   Anorexia 08/26/2021   Abdominal cramping 08/26/2021   Bloating 08/26/2021   Lower abdominal pain 08/26/2021   Chronic idiopathic constipation 08/26/2021   Irritable bowel syndrome with constipation 08/26/2021   Dark stools 08/26/2021   Hematochezia 08/26/2021   History of substance abuse (HCC) 07/06/2021   Iron deficiency anemia secondary to inadequate dietary iron intake 07/06/2021   Recurrent depression  (HCC) 07/06/2021   Chronic hepatitis C without hepatic coma (HCC) 06/14/2020   Anxiety 09/29/2019   History of abnormal cervical Pap smear 12/08/2014    Allergies: No Known Allergies Medications:  Current Outpatient Medications:    doxycycline (VIBRA-TABS) 100 MG tablet, Take 1 tablet (100 mg total) by mouth 2 (two) times daily., Disp: 20 tablet, Rfl: 0   fluconazole (DIFLUCAN) 150 MG tablet, Take 1 tablet PO once. Repeat in 3 days if needed., Disp: 2 tablet, Rfl: 0   permethrin (ELIMITE) 5 % cream, Apply 1 Application topically once for 1 dose. Wash off in the morning., Disp: 60 g, Rfl: 0   LINZESS 145 MCG CAPS capsule, Take 145 mcg by mouth every morning., Disp: , Rfl:    tretinoin (RETIN-A) 0.025 % cream, Apply topically at bedtime., Disp: 45 g, Rfl: 0   triamcinolone cream (KENALOG) 0.1 %, Apply 1 Application topically 2 (two) times daily., Disp: 30 g, Rfl: 0  Observations/Objective: Patient is well-developed, well-nourished in no acute distress.  Resting comfortably at home.  Head is normocephalic, atraumatic.  No labored breathing. Speech is clear and coherent with logical content.  Patient is alert and oriented at baseline.  Left upper eyelid with noted stye of lateral eyelid.  No active drainage noted.  No left conjunctival injection noted.  Assessment and Plan: 1. Mite infestation - permethrin (ELIMITE) 5 % cream; Apply 1 Application topically once for 1 dose. Wash off in the morning.  Dispense: 60 g; Refill: 0  Concern for ongoing infestation.  She needs to make sure the dog gets treated and keep him out of the bed.  Wash bed linens in hot water and store in an airtight container for 1 week.  Rewash before reuse.  Will prescribe permethrin cream to apply at night x1.  Wash off first thing in the morning.  Can be repeated in 7 days if needed.   2. Cellulitis of buttock - doxycycline (VIBRA-TABS) 100 MG tablet; Take 1 tablet (100 mg total) by mouth 2 (two) times daily.   Dispense: 20 tablet; Refill: 0  Secondary to mite bite of gluteal region.  We will start doxycycline twice daily for 10 days as we are also using to treat an acute bacterial sinusitis.  Supportive measures and OTC medications reviewed with patient.  Will need in person follow-up if symptoms not resolving or for any new or worsening symptoms despite treatment.  3. Hordeolum externum of right eyelid  No evidence of active infection.  Recommend warm compresses 10 to 15 minutes a few times daily.  Reassured her that this can take several weeks to fully resolve.  If not she needs follow-up with her PCP for referral to an ophthalmologist.  4. Acute bacterial sinusitis - doxycycline (VIBRA-TABS) 100 MG tablet; Take 1 tablet (100 mg total) by mouth 2 (two) times daily.  Dispense: 20 tablet; Refill: 0  Rx doxycycline.  Increase fluids.  Rest.  Saline nasal spray.  Probiotic.  Mucinex as directed.  Humidifier in bedroom.  Call or return to clinic if symptoms are not improving.   Follow Up  Instructions: I discussed the assessment and treatment plan with the patient. The patient was provided an opportunity to ask questions and all were answered. The patient agreed with the plan and demonstrated an understanding of the instructions.  A copy of instructions were sent to the patient via MyChart unless otherwise noted below.   The patient was advised to call back or seek an in-person evaluation if the symptoms worsen or if the condition fails to improve as anticipated.  Time:  I spent 25 minutes with the patient via telehealth technology discussing the above problems/concerns.    Leeanne Rio, PA-C

## 2022-01-28 NOTE — Telephone Encounter (Signed)
Had one visit 10/25/21 and declined AEX.  Last PAP in Epic 2016 with another gyn.  Her birth control bills were not prescribed by Wende Crease, NP

## 2022-01-30 NOTE — Telephone Encounter (Signed)
Unable to refill. Will need to obtain from previous prescriber. No showed last appt

## 2022-01-31 ENCOUNTER — Other Ambulatory Visit: Payer: Self-pay | Admitting: Gastroenterology

## 2022-01-31 DIAGNOSIS — K759 Inflammatory liver disease, unspecified: Secondary | ICD-10-CM | POA: Diagnosis not present

## 2022-01-31 DIAGNOSIS — R69 Illness, unspecified: Secondary | ICD-10-CM | POA: Diagnosis not present

## 2022-01-31 DIAGNOSIS — K589 Irritable bowel syndrome without diarrhea: Secondary | ICD-10-CM | POA: Diagnosis not present

## 2022-02-06 DIAGNOSIS — M9904 Segmental and somatic dysfunction of sacral region: Secondary | ICD-10-CM | POA: Diagnosis not present

## 2022-02-06 DIAGNOSIS — M9908 Segmental and somatic dysfunction of rib cage: Secondary | ICD-10-CM | POA: Diagnosis not present

## 2022-02-06 DIAGNOSIS — M9906 Segmental and somatic dysfunction of lower extremity: Secondary | ICD-10-CM | POA: Diagnosis not present

## 2022-02-06 DIAGNOSIS — M9902 Segmental and somatic dysfunction of thoracic region: Secondary | ICD-10-CM | POA: Diagnosis not present

## 2022-02-06 DIAGNOSIS — M9901 Segmental and somatic dysfunction of cervical region: Secondary | ICD-10-CM | POA: Diagnosis not present

## 2022-02-06 DIAGNOSIS — M5414 Radiculopathy, thoracic region: Secondary | ICD-10-CM | POA: Diagnosis not present

## 2022-02-06 DIAGNOSIS — M9903 Segmental and somatic dysfunction of lumbar region: Secondary | ICD-10-CM | POA: Diagnosis not present

## 2022-02-13 DIAGNOSIS — M9904 Segmental and somatic dysfunction of sacral region: Secondary | ICD-10-CM | POA: Diagnosis not present

## 2022-02-13 DIAGNOSIS — M9908 Segmental and somatic dysfunction of rib cage: Secondary | ICD-10-CM | POA: Diagnosis not present

## 2022-02-13 DIAGNOSIS — M9903 Segmental and somatic dysfunction of lumbar region: Secondary | ICD-10-CM | POA: Diagnosis not present

## 2022-02-13 DIAGNOSIS — M9901 Segmental and somatic dysfunction of cervical region: Secondary | ICD-10-CM | POA: Diagnosis not present

## 2022-02-13 DIAGNOSIS — M9906 Segmental and somatic dysfunction of lower extremity: Secondary | ICD-10-CM | POA: Diagnosis not present

## 2022-02-13 DIAGNOSIS — M5414 Radiculopathy, thoracic region: Secondary | ICD-10-CM | POA: Diagnosis not present

## 2022-02-13 DIAGNOSIS — M9902 Segmental and somatic dysfunction of thoracic region: Secondary | ICD-10-CM | POA: Diagnosis not present

## 2022-02-14 DIAGNOSIS — R69 Illness, unspecified: Secondary | ICD-10-CM | POA: Diagnosis not present

## 2022-02-14 DIAGNOSIS — K759 Inflammatory liver disease, unspecified: Secondary | ICD-10-CM | POA: Diagnosis not present

## 2022-02-14 DIAGNOSIS — K589 Irritable bowel syndrome without diarrhea: Secondary | ICD-10-CM | POA: Diagnosis not present

## 2022-03-13 DIAGNOSIS — M9903 Segmental and somatic dysfunction of lumbar region: Secondary | ICD-10-CM | POA: Diagnosis not present

## 2022-03-13 DIAGNOSIS — M9906 Segmental and somatic dysfunction of lower extremity: Secondary | ICD-10-CM | POA: Diagnosis not present

## 2022-03-13 DIAGNOSIS — M9904 Segmental and somatic dysfunction of sacral region: Secondary | ICD-10-CM | POA: Diagnosis not present

## 2022-03-13 DIAGNOSIS — M9908 Segmental and somatic dysfunction of rib cage: Secondary | ICD-10-CM | POA: Diagnosis not present

## 2022-03-13 DIAGNOSIS — M9901 Segmental and somatic dysfunction of cervical region: Secondary | ICD-10-CM | POA: Diagnosis not present

## 2022-03-13 DIAGNOSIS — M5414 Radiculopathy, thoracic region: Secondary | ICD-10-CM | POA: Diagnosis not present

## 2022-03-13 DIAGNOSIS — M9902 Segmental and somatic dysfunction of thoracic region: Secondary | ICD-10-CM | POA: Diagnosis not present

## 2022-03-20 ENCOUNTER — Telehealth: Payer: 59 | Admitting: Physician Assistant

## 2022-03-20 DIAGNOSIS — N941 Unspecified dyspareunia: Secondary | ICD-10-CM

## 2022-03-20 NOTE — Progress Notes (Signed)
Because of painful intercourse and vaginal pain with symptoms, we require and exam and vaginal swabbing to make sure nothing more significant present. I recommend that you be seen in a face to face visit.   NOTE: There will be NO CHARGE for this eVisit   If you are having a true medical emergency please call 911.      For an urgent face to face visit, [facility] has seven urgent care centers for your convenience:     [facility] at [facility] Directions [phone] [address] [place], [place] [address]    [facility] Health Urgent Care Center [facility]) Get Driving Directions [phone] [address] [place], [place] [address]  [facility] Health Urgent Care Center [facility]) Get Driving Directions [phone] [address] [place],  [place]  [address]  [facility] Health Urgent Care Center [facility] [employer] Directions  [phone] [place],  [place] [address]   [facility] Health Urgent Care at [facility] Get Driving Directions [phone] [address] [place], [place] [address]   [facility] Health Urgent Care at [facility] Get Driving Directions  [phone] [address] [place], [place] [address]   [facility] Health Urgent Care at [facility] Directions [phone] [address], [address] [place], [place] [address]  Your MyChart E-visit questionnaire answers were reviewed by a board certified advanced clinical practitioner to complete your personal care plan based on your specific symptoms.  Thank you for using e-Visits.

## 2022-04-01 ENCOUNTER — Telehealth: Payer: 59 | Admitting: Emergency Medicine

## 2022-04-01 DIAGNOSIS — R21 Rash and other nonspecific skin eruption: Secondary | ICD-10-CM

## 2022-04-01 NOTE — Progress Notes (Signed)
Without an image of some sort, we will not be able to accurately diagnose or treat your rash.  Given the sensitive location that you rash is located, I feel your condition warrants further evaluation and I recommend that you be seen in a face to face visit.   NOTE: There will be NO CHARGE for this eVisit   If you are having a true medical emergency please call 911.      For an urgent face to face visit, [facility] has seven urgent care centers for your convenience:     [facility] Center at [facility] Directions [phone] [address] [place], [place] [address]    [facility] Health Urgent Care Center [facility]) Get Driving Directions [phone] [address] [place], [place] [address]  [facility] Health Urgent Care Center [facility]) Get Driving Directions [phone] [address] [place],  [place]  [address]  [facility] Health Urgent Care Center [facility] at [employer] Directions  [phone] [address] [place],  [place] [address]   [facility] Health Urgent Care at [facility] Get Driving Directions [phone] [address] [place], [place] [address]   [facility] Health Urgent Care at [facility] Get Driving Directions  [phone] [address] [place], [place] [address]   [facility] Health Urgent Care at [facility] Directions [phone] [address] [place], [place] [address]  Your MyChart E-visit questionnaire answers were reviewed by a board certified advanced clinical practitioner to complete your personal care plan based on your specific symptoms.  Thank you for using e-Visits.  Approximately 5 minutes was used in reviewing the patient's chart, questionnaire, prescribing medications, and documentation.

## 2022-04-02 ENCOUNTER — Telehealth: Payer: 59 | Admitting: Family Medicine

## 2022-04-02 DIAGNOSIS — R102 Pelvic and perineal pain: Secondary | ICD-10-CM

## 2022-04-02 NOTE — Progress Notes (Signed)
Because of vaginal pain, I feel your condition warrants further evaluation and I recommend that you be seen in a face to face visit.   NOTE: There will be NO CHARGE for this eVisit

## 2022-04-03 ENCOUNTER — Telehealth: Payer: 59 | Admitting: Physician Assistant

## 2022-04-03 DIAGNOSIS — L659 Nonscarring hair loss, unspecified: Secondary | ICD-10-CM | POA: Diagnosis not present

## 2022-04-03 MED ORDER — MINOXIDIL 2.5 MG PO TABS: 1.2500 mg | ORAL_TABLET | Freq: Every day | ORAL | 0 refills | Status: DC

## 2022-04-03 NOTE — Progress Notes (Signed)
The patient no-showed for appointment despite this provider sending direct link x 2 with no response and waiting for at least 10 minutes from appointment time for patient to join. They will be marked as a NS for this appointment/time.   Joni Colegrove M Garyn Arlotta, PA-C    

## 2022-04-03 NOTE — Progress Notes (Signed)
Virtual Visit Consent   Sheila Merritt, you are scheduled for a virtual visit with a [facility] provider today. Just as with appointments in the office, your consent must be obtained to participate. Your consent will be active for this visit and any virtual visit you may have with one of our providers in the next 365 days. If you have a MyChart account, a copy of this consent can be sent to you electronically.  As this is a virtual visit, video technology does not allow for your provider to perform a traditional examination. This may limit your provider's ability to fully assess your condition. If your provider identifies any concerns that need to be evaluated in person or the need to arrange testing (such as labs, EKG, etc.), we will make arrangements to do so. Although advances in technology are sophisticated, we cannot ensure that it will always work on either your end or our end. If the connection with a video visit is poor, the visit may have to be switched to a telephone visit. With either a video or telephone visit, we are not always able to ensure that we have a secure connection.  By engaging in this virtual visit, you consent to the provision of healthcare and authorize for your insurance to be billed (if applicable) for the services provided during this visit. Depending on your insurance coverage, you may receive a charge related to this service.  I need to obtain your verbal consent now. Are you willing to proceed with your visit today? Jayme Mednick has provided verbal consent on 04/03/2022 for a virtual visit (video or telephone). Margaretann Loveless, PA-C  Date: 04/03/2022 4:52 PM  Virtual Visit via Video Note   I, Margaretann Loveless, connected with  Sheila Merritt  ([id number][birth year]) on 04/03/22 at  4:00 PM EST by a video-enabled telemedicine application and verified that I am speaking with the correct person using two identifiers.  Location: Patient: Virtual Visit  Location Patient: Home Provider: Virtual Visit Location Provider: Home Office   I discussed the limitations of evaluation and management by telemedicine and the availability of in person appointments. The patient expressed understanding and agreed to proceed.    History of Present Illness: Sheila Merritt is a 31 y.o. who identifies as a female who was assigned female at birth, and is being seen today for hair loss. Reports has been a recurrent issue. Has used Minoxidil oral tablets in the past successfully, from [facility]. Has had labs in the last year with PCP for hair loss with normal findings. Has been trying an OTC hair, nail supplement without improvements.    Problems:  Patient Active Problem List   Diagnosis Date Noted   History of hepatitis C 08/26/2021   History of parasitic infection 08/26/2021   Anorexia 08/26/2021   Abdominal cramping 08/26/2021   Bloating 08/26/2021   Lower abdominal pain 08/26/2021   Chronic idiopathic constipation 08/26/2021   Irritable bowel syndrome with constipation 08/26/2021   Dark stools 08/26/2021   Hematochezia 08/26/2021   History of substance abuse (HCC) 07/06/2021   Iron deficiency anemia secondary to inadequate dietary iron intake 07/06/2021   Recurrent depression (HCC) 07/06/2021   Chronic hepatitis C without hepatic coma (HCC) 06/14/2020   Anxiety 09/29/2019   History of abnormal cervical Pap smear 12/08/2014    Allergies: No Known Allergies Medications:  Current Outpatient Medications:    minoxidil (LONITEN) 2.5 MG tablet, Take 0.5 tablets (1.25 mg total) by mouth daily., Disp: 45 tablet, Rfl:  0   doxycycline (VIBRA-TABS) 100 MG tablet, Take 1 tablet (100 mg total) by mouth 2 (two) times daily., Disp: 20 tablet, Rfl: 0   fluconazole (DIFLUCAN) 150 MG tablet, Take 1 tablet PO once. Repeat in 3 days if needed., Disp: 2 tablet, Rfl: 0   LINZESS 145 MCG CAPS capsule, TAKE 1 CAPSULE BY MOUTH ONCE DAILY BEFORE BREAKFAST, Disp: 30 capsule, Rfl:  0   tretinoin (RETIN-A) 0.025 % cream, Apply topically at bedtime., Disp: 45 g, Rfl: 0   triamcinolone cream (KENALOG) 0.1 %, Apply 1 Application topically 2 (two) times daily., Disp: 30 g, Rfl: 0  Observations/Objective: Patient is well-developed, well-nourished in no acute distress.  Resting comfortably at home.  Head is normocephalic, atraumatic.  No labored breathing.  Speech is clear and coherent with logical content.  Patient is alert and oriented at baseline.    Assessment and Plan: 1. Hair loss - minoxidil (LONITEN) 2.5 MG tablet; Take 0.5 tablets (1.25 mg total) by mouth daily.  Dispense: 45 tablet; Refill: 0  - Minoxidil 1.25mg  daily prescribed - Continue hair and nail supplement - Advised if continues to have hair loss needs to follow up with PCP for further evaluation and possibly be referred to dermatology for evaluations.  Follow Up Instructions: I discussed the assessment and treatment plan with the patient. The patient was provided an opportunity to ask questions and all were answered. The patient agreed with the plan and demonstrated an understanding of the instructions.  A copy of instructions were sent to the patient via MyChart unless otherwise noted below.    The patient was advised to call back or seek an in-person evaluation if the symptoms worsen or if the condition fails to improve as anticipated.  Time:  I spent 10 minutes with the patient via telehealth technology discussing the above problems/concerns.    Margaretann Loveless, PA-C

## 2022-04-03 NOTE — Patient Instructions (Signed)
Sheila Merritt, thank you for joining Margaretann Loveless, PA-C for today's virtual visit.  While this provider is not your primary care provider (PCP), if your PCP is located in our provider database this encounter information will be shared with them immediately following your visit.   A [facility] MyChart account gives you access to today's visit and all your visits, tests, and labs performed at [facility] " click here if you don't have a [facility] MyChart account or go to [email or web address]  Consent: (Patient) Sheila Merritt provided verbal consent for this virtual visit at the beginning of the encounter.  Current Medications:  Current Outpatient Medications:    minoxidil (LONITEN) 2.5 MG tablet, Take 0.5 tablets (1.25 mg total) by mouth daily., Disp: 45 tablet, Rfl: 0   doxycycline (VIBRA-TABS) 100 MG tablet, Take 1 tablet (100 mg total) by mouth 2 (two) times daily., Disp: 20 tablet, Rfl: 0   fluconazole (DIFLUCAN) 150 MG tablet, Take 1 tablet PO once. Repeat in 3 days if needed., Disp: 2 tablet, Rfl: 0   LINZESS 145 MCG CAPS capsule, TAKE 1 CAPSULE BY MOUTH ONCE DAILY BEFORE BREAKFAST, Disp: 30 capsule, Rfl: 0   tretinoin (RETIN-A) 0.025 % cream, Apply topically at bedtime., Disp: 45 g, Rfl: 0   triamcinolone cream (KENALOG) 0.1 %, Apply 1 Application topically 2 (two) times daily., Disp: 30 g, Rfl: 0   Medications ordered in this encounter:  Meds ordered this encounter  Medications   minoxidil (LONITEN) 2.5 MG tablet    Sig: Take 0.5 tablets (1.25 mg total) by mouth daily.    Dispense:  45 tablet    Refill:  0    Order Specific Question:   Supervising Provider    Answer:   Merrilee Jansky [id number]     *If you need refills on other medications prior to your next appointment, please contact your pharmacy*  Follow-Up: Call back or seek an in-person evaluation if the symptoms worsen or if the condition fails to improve as anticipated.  [facility] [phone]  Other Instructions Alopecia Areata, Adult  Alopecia areata is a condition that causes hair loss. A person with this condition may lose hair on the scalp in patches. In some cases, a person may lose all the hair on the scalp or all the hair from the face and body. Having this condition can be emotionally difficult, but it is not dangerous. Alopecia areata is an autoimmune disease. This means that your body's defense system (immune system) mistakes normal parts of the body for germs or other things that can make you sick. When you have alopecia areata, the immune system attacks the hair follicles. What are the causes? The cause of this condition is not known. What increases the risk? You are more likely to develop this condition if you have: A family history of alopecia. A family history of another autoimmune disease, including type 1 diabetes and thyroid autoimmune disease. Eczema, asthma, and allergies. Down syndrome. What are the signs or symptoms? The main symptom of this condition is round spots of patchy hair loss on the scalp. The spots may be mildly itchy. Other symptoms include: Short dark hairs in the bald patches that are wider at the top (exclamation point hairs). Dents, white spots, or lines in the fingernails or toenails. Balding and body hair loss. This is rare. Alopecia areata usually develops in childhood, but it can develop at any age. For some people, their hair grows back on its  own and hair loss does not happen again. For others, their hair may fall out and grow back in cycles. The hair loss may last many years. How is this diagnosed? This condition is diagnosed based on your symptoms and family history. Your health care provider will also check your scalp skin, teeth, and nails. Your health care provider may refer you to a specialist in hair and skin disorders (dermatologist). You may also have tests, including: A hair pull test. Blood  tests or other screening tests to check for autoimmune diseases, such as thyroid disease or diabetes. Skin biopsy to confirm the diagnosis. A procedure to examine the skin with a lighted magnifying instrument (dermoscopy). How is this treated? There is no cure for alopecia areata. The goals of treatment are to promote the regrowth of hair and prevent the immune system from overreacting. No single treatment is right for all people with alopecia areata. It depends on the type of hair loss you have and how severe it is. Work with your health care provider to find the best treatment for you. Treatment may include: Regular checkups to make sure the condition is not getting worse . This is called watchful waiting. Using steroid creams or pills for 6-8 weeks to stop the immune reaction and help hair to regrow more quickly. Using other medicines on your skin (topical medicines) to change the immune system response and support the hair growth cycle. Steroid injections. Therapy and counseling with a support group or therapist if you are having trouble coping with hair loss. Follow these instructions at home: Medicines Apply topical creams only as told by your health care provider. Take over-the-counter and prescription medicines only as told by your health care provider. General instructions Learn as much as you can about your condition. Consider getting a wig or products to make hair look fuller or to cover bald spots, if you feel uncomfortable with your appearance. Get therapy or counseling if you are having a hard time coping with hair loss. Ask your health care provider to recommend a counselor or support group. Keep all follow-up visits as told by your health care provider. This is important. Where to find more information National Alopecia Areata Foundation: naaf.org Contact a health care provider if: Your hair loss gets worse, even with treatment. You have new symptoms. You are struggling  emotionally. Get help right away if: You have a sudden worsening of the hair loss. Summary Alopecia areata is an autoimmune condition that makes your body's defense system (immune system) attack the hair follicles. This causes you to lose hair. Having this condition can be emotionally difficult, but it is not dangerous. Treatments may include regular checkups to make sure that the condition is not getting worse, medicines, and steroid injections. This information is not intended to replace advice given to you by your health care provider. Make sure you discuss any questions you have with your health care provider. Document Revised: 06/29/2019 Document Reviewed: 06/29/2019 Elsevier Patient Education  2023 Elsevier Inc.   If you have been instructed to have an in-person evaluation today at a local Urgent Care facility, please use the link below. It will take you to a list of all of our available [facility], including address, phone number and hours of operation. Please do not delay care.  [facility]  If you or a family member do not have a primary care provider, use the link below to schedule a visit and establish care. When you  choose a [facility] primary care physician or advanced practice provider, you gain a long-term partner in health. Find a Primary Care Provider  Learn more about [place]'s in-office and virtual care options: [facility] Now

## 2022-05-10 DIAGNOSIS — N390 Urinary tract infection, site not specified: Secondary | ICD-10-CM | POA: Diagnosis not present

## 2022-08-10 ENCOUNTER — Telehealth: Payer: 59 | Admitting: Nurse Practitioner

## 2022-08-10 DIAGNOSIS — R21 Rash and other nonspecific skin eruption: Secondary | ICD-10-CM

## 2022-08-10 DIAGNOSIS — L811 Chloasma: Secondary | ICD-10-CM

## 2022-08-10 MED ORDER — HYDROQUINONE 4 % EX EMUL: CUTANEOUS | 1 refills | Status: DC

## 2022-08-10 NOTE — Progress Notes (Signed)
Based on what you shared with me it looks like you have rash,that should be evaluated in a face to face office visit. I am not able to detrmine what this rash is through an evisit. In order for proper treatment you will need someone to actually loof at rash.  NOTE: There will be NO CHARGE for this eVisit   If you are having a true medical emergency please call 911.      For an urgent face to face visit, [place] has six urgent care centers for your convenience:     [facility] Center at [facility] Directions [phone] [address] [place], [place] [address]    [facility] Health Urgent Care Center [facility]) Get Driving Directions [phone] [address] [place], [place] [address]  [facility] Health Urgent Care Center [facility]) Get Driving Directions [phone] [address] [place],  [place]  [address]  [facility] Health Urgent Care at [facility] Get Driving Directions [phone] [address] [place], [place] [address]   [facility] Health Urgent Care at [facility] Get Driving Directions  [phone] [address] [place], [place] [address]   [facility] Health Urgent Care at [facility] Directions [phone] [address] [place], [place] [address]  Your MyChart E-visit questionnaire answers were reviewed by a board certified advanced clinical practitioner to complete your personal care plan based on your specific symptoms.  Thank you for using e-Visits.

## 2022-08-10 NOTE — Patient Instructions (Signed)
  Byrd Hesselbach, thank you for joining Bennie Pierini, FNP for today's virtual visit.  While this provider is not your primary care provider (PCP), if your PCP is located in our provider database this encounter information will be shared with them immediately following your visit.   A [facility] MyChart account gives you access to today's visit and all your visits, tests, and labs performed at [facility] " click here if you don't have a [facility] MyChart account or go to [email or web address]  Consent: (Patient) Sheila Merritt provided verbal consent for this virtual visit at the beginning of the encounter.  Current Medications:  Current Outpatient Medications:    Hydroquinone 4 % EMUL, Apply pea size to face nightly, Disp: 30 g, Rfl: 1   doxycycline (VIBRA-TABS) 100 MG tablet, Take 1 tablet (100 mg total) by mouth 2 (two) times daily., Disp: 20 tablet, Rfl: 0   fluconazole (DIFLUCAN) 150 MG tablet, Take 1 tablet PO once. Repeat in 3 days if needed., Disp: 2 tablet, Rfl: 0   LINZESS 145 MCG CAPS capsule, TAKE 1 CAPSULE BY MOUTH ONCE DAILY BEFORE BREAKFAST, Disp: 30 capsule, Rfl: 0   minoxidil (LONITEN) 2.5 MG tablet, Take 0.5 tablets (1.25 mg total) by mouth daily., Disp: 45 tablet, Rfl: 0   tretinoin (RETIN-A) 0.025 % cream, Apply topically at bedtime., Disp: 45 g, Rfl: 0   triamcinolone cream (KENALOG) 0.1 %, Apply 1 Application topically 2 (two) times daily., Disp: 30 g, Rfl: 0   Medications ordered in this encounter:  Meds ordered this encounter  Medications   Hydroquinone 4 % EMUL    Sig: Apply pea size to face nightly    Dispense:  30 g    Refill:  1    Order Specific Question:   Supervising Provider    Answer:   Merrilee Jansky [id number]     *If you need refills on other medications prior to your next appointment, please contact your pharmacy*  Follow-Up: Call back or seek an in-person evaluation if the symptoms worsen or if the condition  fails to improve as anticipated.  [facility] [phone]  Other Instructions Wear sun screen on face at all times under make up Wash face 2x a day Apply cream only once a day- if start to peel avoid until peel stops Heavy moisturizer 5 minutes after apply prescription cream.   If you have been instructed to have an in-person evaluation today at a local Urgent Care facility, please use the link below. It will take you to a list of all of our available [facility], including address, phone number and hours of operation. Please do not delay care.  [facility]  If you or a family member do not have a primary care provider, use the link below to schedule a visit and establish care. When you choose a [facility] primary care physician or advanced practice provider, you gain a long-term partner in health. Find a Primary Care Provider  Learn more about [facility]'s in-office and virtual care options:  - Get Care Now

## 2022-08-10 NOTE — Progress Notes (Signed)
Virtual Visit Consent   Sheila Merritt, you are scheduled for a virtual visit with Sheila Daphine Deutscher, FNP, a [facility] provider, today.     Just as with appointments in the office, your consent must be obtained to participate.  Your consent will be active for this visit and any virtual visit you may have with one of our providers in the next 365 days.     If you have a MyChart account, a copy of this consent can be sent to you electronically.  All virtual visits are billed to your insurance company just like a traditional visit in the office.    As this is a virtual visit, video technology does not allow for your provider to perform a traditional examination.  This may limit your provider's ability to fully assess your condition.  If your provider identifies any concerns that need to be evaluated in person or the need to arrange testing (such as labs, EKG, etc.), we will make arrangements to do so.     Although advances in technology are sophisticated, we cannot ensure that it will always work on either your end or our end.  If the connection with a video visit is poor, the visit may have to be switched to a telephone visit.  With either a video or telephone visit, we are not always able to ensure that we have a secure connection.     I need to obtain your verbal consent now.   Are you willing to proceed with your visit today? YES   Sheila Merritt has provided verbal consent on 08/10/2022 for a virtual visit (video or telephone).   Sheila Daphine Deutscher, FNP   Date: 08/10/2022 1:35 PM   Virtual Visit via Video Note   I, Sheila Merritt, connected with Sheila Merritt ([id number], 10-30-1990) on 08/10/22 at  1:30 PM EDT by a video-enabled telemedicine application and verified that I am speaking with the correct person using two identifiers.  Location: Patient: Virtual Visit Location Patient: Home Provider: Virtual Visit Location Provider: Mobile   I discussed the  limitations of evaluation and management by telemedicine and the availability of in person appointments. The patient expressed understanding and agreed to proceed.    History of Present Illness: Sheila Merritt is a 32 y.o. who identifies as a female who was assigned female at birth, and is being seen today for melasma.  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/2021   Dark stools 08/26/2021   Hematochezia 08/26/2021   History of substance abuse 07/06/2021   Iron deficiency anemia secondary to inadequate dietary iron intake 07/06/2021   Recurrent depression 07/06/2021   Chronic hepatitis C without hepatic coma 06/14/2020   Anxiety 09/29/2019   History of abnormal cervical Pap smear 12/08/2014    Allergies: No Known Allergies Medications:  Current Outpatient Medications:    doxycycline (VIBRA-TABS) 100 MG tablet, Take 1 tablet (100 mg total) by mouth 2 (two) times daily., Disp: 20 tablet, Rfl: 0   fluconazole (DIFLUCAN) 150 MG tablet, Take 1 tablet PO once. Repeat in 3 days if needed., Disp: 2 tablet, Rfl: 0   LINZESS 145 MCG CAPS capsule, TAKE 1 CAPSULE BY MOUTH ONCE  DAILY BEFORE BREAKFAST, Disp: 30 capsule, Rfl: 0   minoxidil (LONITEN) 2.5 MG tablet, Take 0.5 tablets (1.25 mg total) by mouth daily., Disp: 45 tablet, Rfl: 0   tretinoin (RETIN-A) 0.025 % cream, Apply topically at bedtime., Disp: 45 g, Rfl: 0   triamcinolone cream (KENALOG) 0.1 %, Apply 1 Application topically 2 (two) times daily., Disp: 30 g, Rfl: 0  Observations/Objective: Patient is well-developed, well-nourished in no acute distress.  Resting comfortably  at home.  Head is normocephalic,  atraumatic.  No labored breathing.  Speech is clear and coherent with logical content.  Patient is alert and oriented at baseline.    Assessment and Plan:  Sheila Merritt in today with chief complaint of melasma  1. Melasma Wear sun screen on face at all times under make up Wash face 2x a day Apply cream only once a day- if start to peel avoid until peel stops Heavy moisturizer 5 minutes after apply prescription cream.    Follow Up Instructions: I discussed the assessment and treatment plan with the patient. The patient was provided an opportunity to ask questions and all were answered. The patient agreed with the plan and demonstrated an understanding of the instructions.  A copy of instructions were sent to the patient via MyChart.  The patient was advised to call back or seek an in-person evaluation if the symptoms worsen or if the condition fails to improve as anticipated.  Time:  I spent 12 minutes with the patient via telehealth technology discussing the above problems/concerns.    Sheila Daphine Deutscher, FNP

## 2022-08-23 ENCOUNTER — Telehealth: Payer: 59 | Admitting: Physician Assistant

## 2022-08-23 DIAGNOSIS — L811 Chloasma: Secondary | ICD-10-CM

## 2022-08-23 NOTE — Patient Instructions (Signed)
Byrd Hesselbach, thank you for joining Margaretann Loveless, PA-C for today's virtual visit.  While this provider is not your primary care provider (PCP), if your PCP is located in our provider database this encounter information will be shared with them immediately following your visit.   A [facility] MyChart account gives you access to today's visit and all your visits, tests, and labs performed at [facility] " click here if you don't have a [facility] MyChart account or go to [email or web address]  Consent: (Patient) Sheila Merritt provided verbal consent for this virtual visit at the beginning of the encounter.  Current Medications:  Current Outpatient Medications:    doxycycline (VIBRA-TABS) 100 MG tablet, Take 1 tablet (100 mg total) by mouth 2 (two) times daily., Disp: 20 tablet, Rfl: 0   fluconazole (DIFLUCAN) 150 MG tablet, Take 1 tablet PO once. Repeat in 3 days if needed., Disp: 2 tablet, Rfl: 0   Hydroquinone 4 % EMUL, Apply pea size to face nightly, Disp: 30 g, Rfl: 1   LINZESS 145 MCG CAPS capsule, TAKE 1 CAPSULE BY MOUTH ONCE DAILY BEFORE BREAKFAST, Disp: 30 capsule, Rfl: 0   minoxidil (LONITEN) 2.5 MG tablet, Take 0.5 tablets (1.25 mg total) by mouth daily., Disp: 45 tablet, Rfl: 0   tretinoin (RETIN-A) 0.025 % cream, Apply topically at bedtime., Disp: 45 g, Rfl: 0   triamcinolone cream (KENALOG) 0.1 %, Apply 1 Application topically 2 (two) times daily., Disp: 30 g, Rfl: 0   Medications ordered in this encounter:  No orders of the defined types were placed in this encounter.    *If you need refills on other medications prior to your next appointment, please contact your pharmacy*  Follow-Up: Call back or seek an in-person evaluation if the symptoms worsen or if the condition fails to improve as anticipated.  [facility] [phone]  Other Instructions  Melasma  Melasma is a skin condition that causes areas of darker coloring.  It often appears in patches on the cheeks, forehead, upper lip, and neck. These patches can look like a mask. The discolored areas do not itch and are not red or swollen. Melasma does not spread from person to person (is not contagious). What are the causes? The cause of this condition is not known. It can be started by certain triggers, such as: Being out in the sun. Allergies to medicines or cosmetics, such as makeup or face creams. Changes in your hormones. These may come from: Taking birth control medicines. Taking hormone replacement therapy. Being pregnant. What increases the risk? You may be more likely to develop this condition if: You are female. You have a family history of this condition. You have darker skin. You live in a tropical area. What are the signs or symptoms? The only sign of this condition is dark or tan patches on the skin. The patches may appear darker than your skin color. In some cases, melasma may look like freckles. How is this diagnosed? This condition is diagnosed based on: A physical exam. Your health care provider will look at your skin. They may use a special light called a Wood lamp to examine your skin more closely. Biopsy. This is when a sample of your skin is removed and looked at under a microscope. This is done to make sure your melasma is not caused by another condition, such as skin cancer. How is this treated? There is no cure for this condition. Treatment may lighten the color of the  patches. This may be done using: Medicines, such as bleaching or lightening creams. Facial chemical peels. Lasers. Dermabrasion or microdermabrasion. These procedures use tools to scrape and remove the outer layer of skin. New skin can grow in its place. Your melasma may also go away on its own over time. Follow these instructions at home:  General instructions Take or apply over-the-counter and prescription medicines only as told by your health care  provider. Avoid too much exposure to the sun, especially in tropical areas. Wear sunscreen with an SPF of 30 or higher every day. Wear a hat that protects your face from the sun. Use gentle cosmetics that are meant for sensitive skin. Do not use wax to remove hair in areas where you have or have had melasma. Contact a health care provider if: You have new symptoms. Your symptoms get worse. The discolored areas of your skin are bleeding or irritated. This information is not intended to replace advice given to you by your health care provider. Make sure you discuss any questions you have with your health care provider. Document Revised: 09/25/2021 Document Reviewed: 09/25/2021 Elsevier Patient Education  2023 Elsevier Inc.    If you have been instructed to have an in-person evaluation today at a local Urgent Care facility, please use the link below. It will take you to a list of all of our available [facility], including address, phone number and hours of operation. Please do not delay care.  [facility]  If you or a family member do not have a primary care provider, use the link below to schedule a visit and establish care. When you choose a [facility] primary care physician or advanced practice provider, you gain a long-term partner in health. Find a Primary Care Provider  Learn more about [facility]'s in-office and virtual care options: [facility] - Get Care Now

## 2022-08-23 NOTE — Progress Notes (Signed)
Had questions about other Melasma treatments. Has been on Tretinoin but this caused break outs. On Hydroquinone currently and just refilled from a virtual visit on 08/10/22. Advised treatments can take 3 months up to a year and if first line treatments are not working she should follow up with her PCP office for further management and long term management as well. She voiced understanding. Will no charge.

## 2022-09-07 ENCOUNTER — Telehealth: Payer: 59 | Admitting: Nurse Practitioner

## 2022-09-07 DIAGNOSIS — B356 Tinea cruris: Secondary | ICD-10-CM

## 2022-09-07 MED ORDER — CLOTRIMAZOLE-BETAMETHASONE 1-0.05 % EX CREA
1.0000 | TOPICAL_CREAM | Freq: Every day | CUTANEOUS | 0 refills | Status: DC
Start: 2022-09-07 — End: 2023-01-22

## 2022-09-07 NOTE — Progress Notes (Signed)
E-Visit for [employer]  We are sorry that you are not feeling well. Here is how we plan to help!  Based on what you shared with me it looks like you have tinea cruris, or "Jock Itch".  The symptoms of Jock Itch include red, peeling, itchy rash that affects the groin (crease where the leg meets the trunk).  This fungal infection can be spread through shared towels, clothing, bedding, or hard surfaces (particularly in moist areas) such as shower stalls, locker room floors, or pool area that has the fungus present. If you have a fungal infection on one part of your body, you can also spread it to other parts. For instance, men with a fungal infection on their feet sometimes spread it to their groin.  I am recommending:Clotrimazole 1% cream or gel, apply to area twice per day    HOME CARE:  Keep affected area clean, dry, and cool. Wash with soap and shampoo after sports or exercise and dry yourself well after bathing or swimming Wear cotton underwear and change them if they become damp or sweaty. Avoid using swimming pools, public showers, or baths.  GET HELP RIGHT AWAY IF:  Symptoms that don't away after treatment. Severe itching that persists. If your rash spreads or swells. If your rash begins to have drainage or smell. You develop a fever.  MAKE SURE YOU   Understand these instructions. Will watch your condition. Will get help right away if you are not doing well or get worse.  Thank you for choosing an e-visit.  Your e-visit answers were reviewed by a board certified advanced clinical practitioner to complete your personal care plan. Depending upon the condition, your plan could have included both over the counter or prescription medications.  Please review your pharmacy choice. Make sure the pharmacy is open so you can pick up prescription now. If there is a problem, you may contact your provider through [employer] and have the prescription routed to another pharmacy.  Your  safety is important to [place]. If you have drug allergies check your prescription carefully.   For the next 24 hours you can use MyChart to ask questions about today's visit, request a non-urgent call back, or ask for a work or school excuse. You will get an email in the next two days asking about your experience. I hope that your e-visit has been valuable and will speed your recovery.   References or for more information:  [email or web address]=[email or web address]&source=search_result&selectedTitle=3~52&usage_type=default&display_rank=3

## 2022-09-07 NOTE — Progress Notes (Signed)
I have spent 5 minutes in review of e-visit questionnaire, review and updating patient chart, medical decision making and response to patient.  ° °Krish Bailly W Keyla Milone, NP ° °  °

## 2023-01-19 ENCOUNTER — Telehealth: Payer: 59 | Admitting: Physician Assistant

## 2023-01-19 DIAGNOSIS — H01009 Unspecified blepharitis unspecified eye, unspecified eyelid: Secondary | ICD-10-CM

## 2023-01-22 MED ORDER — DESONIDE 0.05 % EX LOTN
TOPICAL_LOTION | Freq: Every day | CUTANEOUS | 0 refills | Status: AC
Start: 2023-01-22 — End: ?

## 2023-01-22 NOTE — Progress Notes (Signed)
E Visit for Rash  We are sorry that you are not feeling well. Here is how we plan to help!   Based on what you have shared with me this is a mild blepharitis or inflammation of the eyelid, most likely from seborrhea and allergens. Keep the skin clean and dry. I want you to get Nizoral shampoo OTC and apply a small amount very carefully to the upper eyelid when showering. Can use a qtip to help with this. Do this every other day for the next 1-2 weeks, then just as needed.  Let sit for a minute and then rinse well. Pat dry after showering. I am giving you a very low potency steroid to apply once daily (no more) for up to a week max. You can also use OTC Vaseline in a thin layer once daily (if you do the steroid in AM, do vaseline in PM and vice versa). If not resolving or anything new or worsening, please seek an in-person evaluation.   HOME CARE:  Take cool showers and avoid direct sunlight. Apply cool compress or wet dressings. Take a bath in an oatmeal bath.  Sprinkle content of one Aveeno packet under running faucet with comfortably warm water.  Bathe for 15-20 minutes, 1-2 times daily.  Pat dry with a towel. Do not rub the rash. Use hydrocortisone cream. Take an antihistamine like Benadryl for widespread rashes that itch.  The adult dose of Benadryl is 25-50 mg by mouth 4 times daily. Caution:  This type of medication may cause sleepiness.  Do not drink alcohol, drive, or operate dangerous machinery while taking antihistamines.  Do not take these medications if you have prostate enlargement.  Read package instructions thoroughly on all medications that you take.  GET HELP RIGHT AWAY IF:  Symptoms don't go away after treatment. Severe itching that persists. If you rash spreads or swells. If you rash begins to smell. If it blisters and opens or develops a yellow-brown crust. You develop a fever. You have a sore throat. You become short of breath.  MAKE SURE YOU:  Understand these  instructions. Will watch your condition. Will get help right away if you are not doing well or get worse.  Thank you for choosing an e-visit.  Your e-visit answers were reviewed by a board certified advanced clinical practitioner to complete your personal care plan. Depending upon the condition, your plan could have included both over the counter or prescription medications.  Please review your pharmacy choice. Make sure the pharmacy is open so you can pick up prescription now. If there is a problem, you may contact your provider through [employer] and have the prescription routed to another pharmacy.  Your safety is important to [place]. If you have drug allergies check your prescription carefully.   For the next 24 hours you can use MyChart to ask questions about today's visit, request a non-urgent call back, or ask for a work or school excuse. You will get an email in the next two days asking about your experience. I hope that your e-visit has been valuable and will speed your recovery.

## 2023-01-22 NOTE — Progress Notes (Signed)
I have spent 5 minutes in review of e-visit questionnaire, review and updating patient chart, medical decision making and response to patient.   Mia Milan Cody Jacklynn Dehaas, PA-C    

## 2023-02-27 ENCOUNTER — Telehealth: Payer: Self-pay

## 2023-02-27 NOTE — Transitions of Care (Post Inpatient/ED Visit) (Signed)
   02/27/2023  Name: Sheila Merritt MRN: [id number] DOB: Sep 16, 1990  Today's TOC FU Call Status: Today's TOC FU Call Status:: Unsuccessful Call (1st Attempt) Unsuccessful Call (1st Attempt) Date: 02/27/23  Attempted to reach the patient regarding the most recent Inpatient/ED visit.  Follow Up Plan: Additional outreach attempts will be made to reach the patient to complete the Transitions of Care (Post Inpatient/ED visit) call.   Jodelle Gross RN, BSN, CCM RN Care Manager  Transitions of Care  [facility] - [facility]  [phone]

## 2023-02-27 NOTE — Transitions of Care (Post Inpatient/ED Visit) (Signed)
   02/27/2023  Name: Sheila Merritt MRN: [id number] DOB: 1991/03/15  Today's TOC FU Call Status: Today's TOC FU Call Status:: Unsuccessful Call (1st Attempt) Unsuccessful Call (1st Attempt) Date: 02/27/23  Attempted to reach the patient regarding the most recent Inpatient/ED visit.  Follow Up Plan: Additional outreach attempts will be made to reach the patient to complete the Transitions of Care (Post Inpatient/ED visit) call.   Signature Karena Addison, LPN [facility] Nurse Health Advisor Direct Dial [phone]

## 2023-02-28 ENCOUNTER — Telehealth: Payer: Self-pay

## 2023-02-28 NOTE — Transitions of Care (Post Inpatient/ED Visit) (Signed)
   02/28/2023  Name: Sheila Merritt MRN: [id number] DOB: 1990-12-16  Today's TOC FU Call Status: Today's TOC FU Call Status:: Unsuccessful Call (2nd Attempt) Unsuccessful Call (2nd Attempt) Date: 02/28/23  Attempted to reach the patient regarding the most recent Inpatient/ED visit.   Follow Up Plan: Additional outreach attempts will be made to reach the patient to complete the Transitions of Care (Post Inpatient/ED visit) call.   Jodelle Gross RN, BSN, CCM RN Care Manager  Transitions of Care  [facility] - [facility]  [phone]

## 2023-03-04 NOTE — Transitions of Care (Post Inpatient/ED Visit) (Unsigned)
   03/04/2023  Name: Sheila Merritt MRN: [id number] DOB: 04-May-1990  Today's TOC FU Call Status: Today's TOC FU Call Status:: Unsuccessful Call (2nd Attempt) Unsuccessful Call (1st Attempt) Date: 02/27/23 Unsuccessful Call (2nd Attempt) Date: 03/04/23  Attempted to reach the patient regarding the most recent Inpatient/ED visit.  Follow Up Plan: Additional outreach attempts will be made to reach the patient to complete the Transitions of Care (Post Inpatient/ED visit) call.   Signature Karena Addison, LPN [facility] Nurse Health Advisor Direct Dial [phone]

## 2023-03-05 NOTE — Transitions of Care (Post Inpatient/ED Visit) (Signed)
   03/05/2023  Name: Sheila Merritt MRN: [id number] DOB: 01-Mar-1991  Today's TOC FU Call Status: Today's TOC FU Call Status:: Unsuccessful Call (3rd Attempt) Unsuccessful Call (1st Attempt) Date: 02/27/23 Unsuccessful Call (2nd Attempt) Date: 03/04/23 Unsuccessful Call (3rd Attempt) Date: 03/05/23  Attempted to reach the patient regarding the most recent Inpatient/ED visit.  Follow Up Plan: No further outreach attempts will be made at this time. We have been unable to contact the patient.  Signature Karena Addison, LPN [facility] Nurse Health Advisor Direct Dial [phone]

## 2023-07-31 ENCOUNTER — Ambulatory Visit: Admitting: Physical Therapy

## 2023-08-06 ENCOUNTER — Ambulatory Visit: Attending: Physician Assistant | Admitting: Physical Therapy

## 2023-08-06 ENCOUNTER — Encounter: Payer: Self-pay | Admitting: Physical Therapy

## 2023-08-06 ENCOUNTER — Other Ambulatory Visit: Payer: Self-pay

## 2023-08-06 DIAGNOSIS — M62830 Muscle spasm of back: Secondary | ICD-10-CM | POA: Diagnosis present

## 2023-08-06 DIAGNOSIS — M5459 Other low back pain: Secondary | ICD-10-CM
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
## 2023-08-11 ENCOUNTER — Ambulatory Visit

## 2023-08-14 ENCOUNTER — Ambulatory Visit

## 2023-08-14 DIAGNOSIS — M5459 Other low back pain: Secondary | ICD-10-CM | POA: Diagnosis not present

## 2023-08-14 DIAGNOSIS — M62830 Muscle spasm of back: Secondary | ICD-10-CM
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
## 2023-08-21 ENCOUNTER — Ambulatory Visit: Admitting: *Deleted

## 2023-08-21 ENCOUNTER — Encounter: Payer: Self-pay | Admitting: *Deleted

## 2023-08-21 DIAGNOSIS — M62830 Muscle spasm of back: Secondary | ICD-10-CM

## 2023-08-21 DIAGNOSIS — M5459 Other low back pain: Secondary | ICD-10-CM | POA: Diagnosis not present
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
## 2023-08-26 ENCOUNTER — Encounter: Admitting: *Deleted

## 2023-08-28 ENCOUNTER — Encounter: Admitting: *Deleted

## 2023-09-02 ENCOUNTER — Ambulatory Visit: Attending: Physician Assistant | Admitting: *Deleted

## 2023-09-02 DIAGNOSIS — M5459 Other low back pain: Secondary | ICD-10-CM | POA: Insufficient documentation

## 2023-09-02 DIAGNOSIS — M62830 Muscle spasm of back: Secondary | ICD-10-CM | POA: Insufficient documentation
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
## 2023-09-09 ENCOUNTER — Encounter: Admitting: *Deleted

## 2023-09-16 ENCOUNTER — Encounter: Admitting: Physical Therapy

## 2023-09-18 ENCOUNTER — Encounter

## 2023-09-25 ENCOUNTER — Ambulatory Visit: Admitting: *Deleted

## 2023-09-25 DIAGNOSIS — M62830 Muscle spasm of back: Secondary | ICD-10-CM

## 2023-09-25 DIAGNOSIS — M5459 Other low back pain: Secondary | ICD-10-CM
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
## 2023-09-25 NOTE — Addendum Note (Signed)
 Addended by: Kamyra Schroeck, [place] W on: 09/25/2023 05:23 PM   Modules accepted: Orders

## 2023-09-30 ENCOUNTER — Encounter: Admitting: *Deleted

## 2023-10-09 ENCOUNTER — Encounter: Payer: Self-pay | Admitting: *Deleted

## 2023-10-09 ENCOUNTER — Ambulatory Visit: Payer: Self-pay | Attending: Physician Assistant | Admitting: *Deleted

## 2023-10-09 DIAGNOSIS — M62830 Muscle spasm of back: Secondary | ICD-10-CM | POA: Insufficient documentation

## 2023-10-09 DIAGNOSIS — M5459 Other low back pain: Secondary | ICD-10-CM | POA: Insufficient documentation
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
## 2023-10-12 ENCOUNTER — Encounter

## 2023-10-13 ENCOUNTER — Telehealth: Payer: MEDICAID | Admitting: Physician Assistant

## 2023-10-13 DIAGNOSIS — B372 Candidiasis of skin and nail: Secondary | ICD-10-CM

## 2023-10-14 ENCOUNTER — Telehealth: Payer: MEDICAID | Admitting: Physician Assistant

## 2023-10-14 ENCOUNTER — Encounter: Admitting: Physical Therapy

## 2023-10-14 DIAGNOSIS — G4700 Insomnia, unspecified: Secondary | ICD-10-CM | POA: Diagnosis not present

## 2023-10-14 MED ORDER — NYSTATIN 100000 UNIT/GM EX CREA
1.0000 | TOPICAL_CREAM | Freq: Two times a day (BID) | CUTANEOUS | 0 refills | Status: AC
Start: 2023-10-14 — End: ?

## 2023-10-14 MED ORDER — HYDROXYZINE PAMOATE 50 MG PO CAPS: 50.0000 mg | ORAL_CAPSULE | Freq: Three times a day (TID) | ORAL | 0 refills | Status: AC | PRN

## 2023-10-14 NOTE — Patient Instructions (Signed)
 Fabian Holster, thank you for joining Hyla Maillard, PA-C for today's virtual visit.  While this provider is not your primary care provider (PCP), if your PCP is located in our provider database this encounter information will be shared with them immediately following your visit.   A [facility] MyChart account gives you access to today's visit and all your visits, tests, and labs performed at [facility]  click here if you don't have a [facility] MyChart account or go to [email or web address]  Consent: (Patient) Sheila Merritt provided verbal consent for this virtual visit at the beginning of the encounter.  Current Medications:  Current Outpatient Medications:    nystatin cream (MYCOSTATIN), Apply 1 Application topically 2 (two) times daily., Disp: 30 g, Rfl: 0   Medications ordered in this encounter:  No orders of the defined types were placed in this encounter.    *If you need refills on other medications prior to your next appointment, please contact your pharmacy*  Follow-Up: Call back or seek an in-person evaluation if the symptoms worsen or if the condition fails to improve as anticipated.  [facility] [phone]  Other Instructions Please take the Hydroxyzine as directed, remembering you can start with 1/2 tablet night. If this works well, no need to increase to the full 50 mg nightly. Please follow the recommendations below. Make sure to call your PCP office to schedule a follow-up for ongoing management.    Sleep Hygiene  Do: (1) Go to bed at the same time each day. (2) Get up from bed at the same time each day. (3) Get regular exercise each day, preferably in the morning.  There is goof evidence that regular exercise improves restful sleep.  This includes stretching and aerobic exercise. (4) Get regular exposure to outdoor or bright lights, especially in the late afternoon. (5) Keep the temperature in your bedroom  comfortable. (6) Keep the bedroom quiet when sleeping. (7) Keep the bedroom dark enough to facilitate sleep. (8) Use your bed only for sleep and sex. (9) Take medications as directed.  It is helpful to take prescribed sleeping pills 1 hour before bedtime, so they are causing drowsiness when you lie down, or 10 hours before getting up, to avoid daytime drowsiness. (10) Use a relaxation exercise just before going to sleep -- imagery, massage, warm bath. (11) Keep your feet and hands warm.  Wear warm socks and/or mittens or gloves to bed.  Don't: (1) Exercise just before going to bed. (2) Engage in stimulating activity just before bed, such as playing a competitive game, watching an exciting program on television, or having an important discussion with a loved one. (3) Have caffeine in the evening (coffee, teas, chocolate, sodas, etc.) (4) Read or watch television in bed. (5) Use alcohol to help you sleep. (6) Go to bed too hungry or too full. (7) Take another person's sleeping pills. (8) Take over-the-counter sleeping pills, without your doctor's knowledge.  Tolerance can develop rapidly with these medications.  Diphenhydramine can have serious side effects for elderly patients. (9) Take daytime naps. (10) Command yourself to go to sleep.  This only makes your mind and body more alert.  If you lie awake for more than 20-30 minutes, get up, go to a different room, participate in a quiet activity (Ex - non-excitable reading or television), and then return to bed when you feel sleepy.  Do this as many times during the night as needed.  This may cause you to  have a night or two of poor sleep but it will train your brain to know when it is time for sleep.    If you have been instructed to have an in-person evaluation today at a local Urgent Care facility, please use the link below. It will take you to a list of all of our available [facility], including address, phone number and hours  of operation. Please do not delay care.  [facility]  If you or a family member do not have a primary care provider, use the link below to schedule a visit and establish care. When you choose a [facility] primary care physician or advanced practice provider, you gain a long-term partner in health. Find a Primary Care Provider  Learn more about [facility]'s in-office and virtual care options: [facility] - Get Care Now

## 2023-10-14 NOTE — Progress Notes (Signed)
 E Visit for Rash  We are sorry that you are not feeling well. Here is how we plan to help!  Based upon your presentation it appears you have a yeast/fungal infection.  I have prescribed: and Nystatin cream apply to the affected area twice daily.   HOME CARE:  Take cool showers and avoid direct sunlight. Apply cool compress or wet dressings. Take a bath in an oatmeal bath.  Sprinkle content of one Aveeno packet under running faucet with comfortably warm water .  Bathe for 15-20 minutes, 1-2 times daily.  Pat dry with a towel. Do not rub the rash. Use hydrocortisone cream. Take an antihistamine like Benadryl for widespread rashes that itch.  The adult dose of Benadryl is 25-50 mg by mouth 4 times daily. Caution:  This type of medication may cause sleepiness.  Do not drink alcohol, drive, or operate dangerous machinery while taking antihistamines.  Do not take these medications if you have prostate enlargement.  Read package instructions thoroughly on all medications that you take.  GET HELP RIGHT AWAY IF:  Symptoms don't go away after treatment. Severe itching that persists. If you rash spreads or swells. If you rash begins to smell. If it blisters and opens or develops a yellow-brown crust. You develop a fever. You have a sore throat. You become short of breath.  MAKE SURE YOU:  Understand these instructions. Will watch your condition. Will get help right away if you are not doing well or get worse.  Thank you for choosing an e-visit.  Your e-visit answers were reviewed by a board certified advanced clinical practitioner to complete your personal care plan. Depending upon the condition, your plan could have included both over the counter or prescription medications.  Please review your pharmacy choice. Make sure the pharmacy is open so you can pick up prescription now. If there is a problem, you may contact your provider through [employer] and have the prescription routed to  another pharmacy.  Your safety is important to us . If you have drug allergies check your prescription carefully.   For the next 24 hours you can use MyChart to ask questions about today's visit, request a non-urgent call back, or ask for a work or school excuse. You will get an email in the next two days asking about your experience. I hope that your e-visit has been valuable and will speed your recovery.

## 2023-10-14 NOTE — Progress Notes (Signed)
 Virtual Visit Consent   Sheila Merritt, you are scheduled for a virtual visit with a [facility] provider today. Just as with appointments in the office, your consent must be obtained to participate. Your consent will be active for this visit and any virtual visit you may have with one of our providers in the next 365 days. If you have a MyChart account, a copy of this consent can be sent to you electronically.  As this is a virtual visit, video technology does not allow for your provider to perform a traditional examination. This may limit your provider's ability to fully assess your condition. If your provider identifies any concerns that need to be evaluated in person or the need to arrange testing (such as labs, EKG, etc.), we will make arrangements to do so. Although advances in technology are sophisticated, we cannot ensure that it will always work on either your end or our end. If the connection with a video visit is poor, the visit may have to be switched to a telephone visit. With either a video or telephone visit, we are not always able to ensure that we have a secure connection.  By engaging in this virtual visit, you consent to the provision of healthcare and authorize for your insurance to be billed (if applicable) for the services provided during this visit. Depending on your insurance coverage, you may receive a charge related to this service.  I need to obtain your verbal consent now. Are you willing to proceed with your visit today? Sheila Merritt has provided verbal consent on 10/14/2023 for a virtual visit (video or telephone). Hyla Maillard, [place]  Date: 10/14/2023 12:28 PM   Virtual Visit via Video Note   I, Hyla Maillard, connected with  Sheila Merritt  ([id number], 1991-03-30) on 10/14/23 at 12:15 PM EDT by a video-enabled telemedicine application and verified that I am speaking with the correct person using two identifiers.  Location: Patient: Virtual Visit  Location Patient: Home Provider: Virtual Visit Location Provider: Home Office   I discussed the limitations of evaluation and management by telemedicine and the availability of in person appointments. The patient expressed understanding and agreed to proceed.    History of Present Illness: Sheila Merritt is a 33 y.o. who identifies as a female who was assigned female at birth, and is being seen today for some ongoing issue with sleep. Notes prior diagnosis of anxiety/depression and some associated insomnia. Insomnia started 3 years ago after loss of fiance. Hx of substance use but has been sober from pain medication for some time now. Notes is doing very well but recurrence of insomnia after this. Notes main issue with sleep onset insomnia but occasional issue of sleep maintenance insomnia. Has tried OTC Melatonin, Tylenol  PM with much help.  Notes mood stable overall, denying SI/HI. Has been on Trazodone previously but makes her feel too sleepy the next day and affected mood.    HPI: HPI  Problems:  Patient Active Problem List   Diagnosis Date Noted   History of hepatitis C 08/26/2021   History of parasitic infection 08/26/2021   Anorexia 08/26/2021   Abdominal cramping 08/26/2021   Bloating 08/26/2021   Lower abdominal pain 08/26/2021   Chronic idiopathic constipation 08/26/2021   Irritable bowel syndrome with constipation 08/26/2021   Dark stools 08/26/2021   Hematochezia 08/26/2021   History of substance abuse (HCC) 07/06/2021   Iron deficiency anemia secondary to inadequate dietary iron intake 07/06/2021   Recurrent depression (HCC) 07/06/2021  Chronic hepatitis C without hepatic coma (HCC) 06/14/2020   Anxiety 09/29/2019   History of abnormal cervical Pap smear 12/08/2014    Allergies: No Known Allergies Medications:  Current Outpatient Medications:    nystatin cream (MYCOSTATIN), Apply 1 Application topically 2 (two) times daily., Disp: 30 g, Rfl:  0  Observations/Objective: Patient is well-developed, well-nourished in no acute distress.  Resting comfortably at home.  Head is normocephalic, atraumatic.  No labored breathing.  Speech is clear and coherent with logical content.  Patient is alert and oriented at baseline.   Assessment and Plan: 1. Insomnia, unspecified type (Primary)  No alarm signs/symptoms present.  Sleep hygiene practice reviewed.  Will start trial of Hydroxyzine as needed to help as non habit-forming. She is to schedule a follow-up with her PCP for ongoing management.   Follow Up Instructions: I discussed the assessment and treatment plan with the patient. The patient was provided an opportunity to ask questions and all were answered. The patient agreed with the plan and demonstrated an understanding of the instructions.  A copy of instructions were sent to the patient via MyChart unless otherwise noted below.   The patient was advised to call back or seek an in-person evaluation if the symptoms worsen or if the condition fails to improve as anticipated.    Hyla Maillard, PA-C

## 2023-10-14 NOTE — Progress Notes (Signed)
 I have spent 5 minutes in review of e-visit questionnaire, review and updating patient chart, medical decision making and response to patient.   [name], PA-C
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
## 2023-11-26 ENCOUNTER — Telehealth: Payer: MEDICAID | Admitting: Family Medicine

## 2023-11-26 DIAGNOSIS — J019 Acute sinusitis, unspecified: Secondary | ICD-10-CM

## 2023-11-26 DIAGNOSIS — B9689 Other specified bacterial agents as the cause of diseases classified elsewhere: Secondary | ICD-10-CM | POA: Diagnosis not present

## 2023-11-26 MED ORDER — AMOXICILLIN 875 MG PO TABS: 875.0000 mg | ORAL_TABLET | Freq: Two times a day (BID) | ORAL | 0 refills | Status: AC

## 2023-11-26 MED ORDER — FLUTICASONE PROPIONATE 50 MCG/ACT NA SUSP: 2.0000 | Freq: Every day | NASAL | 6 refills | Status: AC

## 2023-11-26 NOTE — Progress Notes (Signed)
 Virtual Visit Consent   Temitope Flammer, you are scheduled for a virtual visit with a [facility] provider today. Just as with appointments in the office, your consent must be obtained to participate. Your consent will be active for this visit and any virtual visit you may have with one of our providers in the next 365 days. If you have a MyChart account, a copy of this consent can be sent to you electronically.  As this is a virtual visit, video technology does not allow for your provider to perform a traditional examination. This may limit your provider's ability to fully assess your condition. If your provider identifies any concerns that need to be evaluated in person or the need to arrange testing (such as labs, EKG, etc.), we will make arrangements to do so. Although advances in technology are sophisticated, we cannot ensure that it will always work on either your end or our end. If the connection with a video visit is poor, the visit may have to be switched to a telephone visit. With either a video or telephone visit, we are not always able to ensure that we have a secure connection.  By engaging in this virtual visit, you consent to the provision of healthcare and authorize for your insurance to be billed (if applicable) for the services provided during this visit. Depending on your insurance coverage, you may receive a charge related to this service.  I need to obtain your verbal consent now. Are you willing to proceed with your visit today? Saroya Riccobono has provided verbal consent on 11/26/2023 for a virtual visit (video or telephone). Loa Lamp, FNP  Date: 11/26/2023 1:47 PM   Virtual Visit via Video Note   I, Loa Lamp, connected with  Ladene Allocca  ([id number], 04-Apr-1991) on 11/26/23 at  1:45 PM EDT by a video-enabled telemedicine application and verified that I am speaking with the correct person using two identifiers.  Location: Patient: Virtual Visit Location Patient:  Home Provider: Virtual Visit Location Provider: Home Office   I discussed the limitations of evaluation and management by telemedicine and the availability of in person appointments. The patient expressed understanding and agreed to proceed.    History of Present Illness: Constancia Geeting is a 33 y.o. who identifies as a female who was assigned female at birth, and is being seen today for sinus pressure and pain with post nasal drainage and headache for 3-4 weeks persistent. No fever, no cough. .  HPI: HPI  Problems:  Patient Active Problem List   Diagnosis Date Noted   History of hepatitis C 08/26/2021   History of parasitic infection 08/26/2021   Anorexia 08/26/2021   Abdominal cramping 08/26/2021   Bloating 08/26/2021   Lower abdominal pain 08/26/2021   Chronic idiopathic constipation 08/26/2021   Irritable bowel syndrome with constipation 08/26/2021   Dark stools 08/26/2021   Hematochezia 08/26/2021   History of substance abuse (HCC) 07/06/2021   Iron deficiency anemia secondary to inadequate dietary iron intake 07/06/2021   Recurrent depression (HCC) 07/06/2021   Chronic hepatitis C without hepatic coma (HCC) 06/14/2020   Anxiety 09/29/2019   History of abnormal cervical Pap smear 12/08/2014    Allergies: No Known Allergies Medications:  Current Outpatient Medications:    hydrOXYzine  (VISTARIL ) 50 MG capsule, Take 1 capsule (50 mg total) by mouth 3 (three) times daily as needed., Disp: 30 capsule, Rfl: 0   nystatin  cream (MYCOSTATIN ), Apply 1 Application topically 2 (two) times daily., Disp: 30 g, Rfl: 0  Observations/Objective: Patient is well-developed, well-nourished in no acute distress.  Resting comfortably  at home.  Head is normocephalic, atraumatic.  No labored breathing.  Speech is clear and coherent with logical content.  Patient is alert and oriented at baseline.    Assessment and Plan: 1. Acute bacterial sinusitis (Primary)  Increase fluids, humidifier  at night, tylenol , UC if sx worsen.   Follow Up Instructions: I discussed the assessment and treatment plan with the patient. The patient was provided an opportunity to ask questions and all were answered. The patient agreed with the plan and demonstrated an understanding of the instructions.  A copy of instructions were sent to the patient via MyChart unless otherwise noted below.     The patient was advised to call back or seek an in-person evaluation if the symptoms worsen or if the condition fails to improve as anticipated.    Latora Quarry, FNP

## 2023-11-26 NOTE — Patient Instructions (Signed)

## 2023-12-04 ENCOUNTER — Ambulatory Visit: Payer: MEDICAID | Attending: Internal Medicine | Admitting: Physical Therapy

## 2023-12-04 DIAGNOSIS — M5459 Other low back pain: Secondary | ICD-10-CM | POA: Diagnosis present

## 2023-12-04 DIAGNOSIS — M62830 Muscle spasm of back: Secondary | ICD-10-CM | POA: Diagnosis present

## 2023-12-04 NOTE — Therapy (Signed)
 OUTPATIENT PHYSICAL THERAPY THORACOLUMBAR EVALUATION   Patient Name: Sheila Merritt MRN: [id number] DOB:05/24/1990, 33 y.o., female Today's Date: 12/04/2023  END OF SESSION:  PT End of Session - 12/04/23 1742     Visit Number 1    Number of Visits 8    Date for PT Re-Evaluation 01/15/24    PT Start Time 0150    PT Stop Time 0221    PT Time Calculation (min) 31 min    Activity Tolerance Patient tolerated treatment well    Behavior During Therapy [facility] for tasks assessed/performed          Past Medical History:  Diagnosis Date   Abnormal Pap smear    Anemia    BV (bacterial vaginosis)    Hepatitis C    History of abnormal cervical Pap smear 12/08/2014   Irritable bowel syndrome    UTI (lower urinary tract infection)    Vaginal Pap smear, abnormal    Past Surgical History:  Procedure Laterality Date   CERVICAL CONIZATION W/BX N/A 01/27/2015   Procedure: LASER CONIZATION OF CERVIX WITH BIOPSY;  Surgeon: Vonn VEAR Inch, MD;  Location: [facility];  Service: Gynecology;  Laterality: N/A;   HOLMIUM LASER APPLICATION N/A 01/27/2015   Procedure: HOLMIUM LASER APPLICATION;  Surgeon: Vonn VEAR Inch, MD;  Location: [facility];  Service: Gynecology;  Laterality: N/A;   NO PAST SURGERIES     Patient Active Problem List   Diagnosis Date Noted   History of hepatitis C 08/26/2021   History of parasitic infection 08/26/2021   Anorexia 08/26/2021   Abdominal cramping 08/26/2021   Bloating 08/26/2021   Lower abdominal pain 08/26/2021   Chronic idiopathic constipation 08/26/2021   Irritable bowel syndrome with constipation 08/26/2021   Dark stools 08/26/2021   Hematochezia 08/26/2021   History of substance abuse (HCC) 07/06/2021   Iron deficiency anemia secondary to inadequate dietary iron intake 07/06/2021   Recurrent depression (HCC) 07/06/2021   Chronic hepatitis C without hepatic coma (HCC) 06/14/2020   Anxiety 09/29/2019   History of abnormal cervical Pap smear 12/08/2014     REFERRING PROVIDER: Renato Record  REFERRING DIAG: Lumbar arthropathy.  Spondylopathy.  Rationale for Evaluation and Treatment: Rehabilitation  THERAPY DIAG:  Other low back pain - Plan: PT plan of care cert/re-cert  Muscle spasm of back - Plan: PT plan of care cert/re-cert  ONSET DATE: 02/23/24 (surgery date).  SUBJECTIVE:                                                                                                                                                                                           SUBJECTIVE STATEMENT:  The patient returns to PT last seen 2 months ago.  She rates her pain at a 1-2/10 and will increase with too much strain.  Ibuprofen and stretches decrease her her.  She describes her pain as sore.    PERTINENT HISTORY:  Lumbar surgery.  Calcaneal fracture.    PAIN:  Are you having pain? Yes: NPRS scale: 1-2/10. Pain location: Low back. Pain description: As above. Aggravating factors: As above. Relieving factors: As above.    PRECAUTIONS: Other: Lumbar fusion.    RED FLAGS: None   WEIGHT BEARING RESTRICTIONS: No  FALLS:  Has patient fallen in last 6 months? No   PLOF: Independent  PATIENT GOALS: Increase function and do more with minimal pain.      OBJECTIVE:   POSTURE: No Significant postural limitations  PALPATION: Minimal palpable lumbar pain but a notable increase in tone on right.    LUMBAR ROM:   Full flexion and extension to 10 degrees.  LOWER EXTREMITY ROM:     WNL.  LOWER EXTREMITY MMT:    Normal LE strength.  GAIT: WNL.  TREATMENT DATE: Prone over pillow for comfort:  STW/M x 9 minutes to patient's lumbar musculature, right > left.                                                                                                                                   PATIENT EDUCATION:  Education details:  Person educated:  International aid/development worker:  Education comprehension:   HOME EXERCISE  PROGRAM:   ASSESSMENT:  CLINICAL IMPRESSION: The patient returns to PT and overall doing much better since she was originally seen in April of this year.  Her pain was low upon presentation to the clinic today.  She exhibits increased lumbar musculature tone on the right.  Her gait is essentially normal.  She has no LE strength deficits.  She will benefit from skilled PT intervention.    OBJECTIVE IMPAIRMENTS: decreased activity tolerance, increased muscle spasms, and pain.   ACTIVITY LIMITATIONS: carrying, lifting, and bending  PARTICIPATION LIMITATIONS: yard work  PERSONAL FACTORS: Time since onset of injury/illness/exacerbation and 1 comorbidity: lumbar surgery are also affecting patient's functional outcome.   REHAB POTENTIAL: Excellent  CLINICAL DECISION MAKING: Stable/uncomplicated  EVALUATION COMPLEXITY: Low   GOALS:  LONG TERM GOALS: Target date: 01/01/24.  Ind with an advanced HEP.  Goal status: INITIAL  2.  Walk a community distance with pain not > 2-3/10.  Goal status: INITIAL  3.  Perform ADL's with pain not > 2-3/10.  Goal status: INITIAL  PLAN:  PT FREQUENCY: 2x/week  PT DURATION: 6 weeks  PLANNED INTERVENTIONS: 97110-Therapeutic exercises, 97530- Therapeutic activity, [id number]- Neuromuscular re-education, 97535- Self Care, 02859- Manual therapy, G0283- Electrical stimulation (unattended), 97035- Ultrasound, Patient/Family education, and Moist heat.  PLAN FOR NEXT SESSION: Advance core exercises.  STW/M and modalities as needed.     Arrin Ishler, [place], PT 12/04/2023, 6:13 PM

## 2023-12-08 NOTE — Therapy (Incomplete)
 OUTPATIENT PHYSICAL THERAPY THORACOLUMBAR TREATMENT   Patient Name: Sheila Merritt MRN: [id number] DOB:12-Aug-1990, 33 y.o., female Today's Date: 12/08/2023  END OF SESSION:    Past Medical History:  Diagnosis Date   Abnormal Pap smear    Anemia    BV (bacterial vaginosis)    Hepatitis C    History of abnormal cervical Pap smear 12/08/2014   Irritable bowel syndrome    UTI (lower urinary tract infection)    Vaginal Pap smear, abnormal    Past Surgical History:  Procedure Laterality Date   CERVICAL CONIZATION W/BX N/A 01/27/2015   Procedure: LASER CONIZATION OF CERVIX WITH BIOPSY;  Surgeon: Vonn VEAR Inch, MD;  Location: [facility];  Service: Gynecology;  Laterality: N/A;   HOLMIUM LASER APPLICATION N/A 01/27/2015   Procedure: HOLMIUM LASER APPLICATION;  Surgeon: Vonn VEAR Inch, MD;  Location: [facility];  Service: Gynecology;  Laterality: N/A;   NO PAST SURGERIES     Patient Active Problem List   Diagnosis Date Noted   History of hepatitis C 08/26/2021   History of parasitic infection 08/26/2021   Anorexia 08/26/2021   Abdominal cramping 08/26/2021   Bloating 08/26/2021   Lower abdominal pain 08/26/2021   Chronic idiopathic constipation 08/26/2021   Irritable bowel syndrome with constipation 08/26/2021   Dark stools 08/26/2021   Hematochezia 08/26/2021   History of substance abuse (HCC) 07/06/2021   Iron deficiency anemia secondary to inadequate dietary iron intake 07/06/2021   Recurrent depression (HCC) 07/06/2021   Chronic hepatitis C without hepatic coma (HCC) 06/14/2020   Anxiety 09/29/2019   History of abnormal cervical Pap smear 12/08/2014    REFERRING PROVIDER: Renato Record  REFERRING DIAG: Lumbar arthropathy.  Spondylopathy.  Rationale for Evaluation and Treatment: Rehabilitation  THERAPY DIAG:  No diagnosis found.  ONSET DATE: 02/23/23 (surgery date).  SUBJECTIVE:                                                                                                                                                                                            SUBJECTIVE STATEMENT: ***   PERTINENT HISTORY:  Lumbar surgery.  Calcaneal fracture.    PAIN:  Are you having pain? Yes: NPRS scale: 1-2/10. Pain location: Low back. Pain description: As above. Aggravating factors: As above. Relieving factors: As above.    PRECAUTIONS: Other: Lumbar fusion.    RED FLAGS: None   WEIGHT BEARING RESTRICTIONS: No  FALLS:  Has patient fallen in last 6 months? No   PLOF: Independent  PATIENT GOALS: Increase function and do more with minimal pain.      OBJECTIVE:   POSTURE: No Significant postural limitations  PALPATION: Minimal palpable lumbar pain but a notable increase in tone on right.    LUMBAR ROM:   Full flexion and extension to 10 degrees.  LOWER EXTREMITY ROM:     WNL.  LOWER EXTREMITY MMT:    Normal LE strength.  GAIT: WNL.  TREATMENT DATE:  12/09/23   EXERCISE LOG  LB  fusion   Exercise Repetitions and Resistance Comments  Recumbent bike       Nustep L3 seat 5 x 12    mins    Standing bird-dog X 8 hold 10 secs each side    Rocker board       Ball roll out    Added to HEP  Seated hip ADD isometric       XTS  row blue 2 x 10 hold 5 secs each Added to HEP  XTS blue  Ext  2 x 10 reps each hold 10 secs  Added to HEP  Drawin      AB bracing      Bent knee raise      Bridge      Manual.    STW to Bil LB paras  with Pt prone   Eval: Prone over pillow for comfort:  STW/M x 9 minutes to patient's lumbar musculature, right > left.                                                                                                                                   PATIENT EDUCATION:  Education details:  Person educated:  International aid/development worker:  Education comprehension:   HOME EXERCISE PROGRAM:   ASSESSMENT:  CLINICAL IMPRESSION:   ***The patient returns to PT and overall doing much better since she was originally seen  in April of this year.  Her pain was low upon presentation to the clinic today.  She exhibits increased lumbar musculature tone on the right.  Her gait is essentially normal.  She has no LE strength deficits.  She will benefit from skilled PT intervention.    OBJECTIVE IMPAIRMENTS: decreased activity tolerance, increased muscle spasms, and pain.   ACTIVITY LIMITATIONS: carrying, lifting, and bending  PARTICIPATION LIMITATIONS: yard work  PERSONAL FACTORS: Time since onset of injury/illness/exacerbation and 1 comorbidity: lumbar surgery are also affecting patient's functional outcome.   REHAB POTENTIAL: Excellent  CLINICAL DECISION MAKING: Stable/uncomplicated  EVALUATION COMPLEXITY: Low   GOALS:  LONG TERM GOALS: Target date: 01/01/24.  Ind with an advanced HEP.  Goal status: INITIAL  2.  Walk a community distance with pain not > 2-3/10.  Goal status: INITIAL  3.  Perform ADL's with pain not > 2-3/10.  Goal status: INITIAL  PLAN:  PT FREQUENCY: 2x/week  PT DURATION: 6 weeks  PLANNED INTERVENTIONS: 97110-Therapeutic exercises, 97530- Therapeutic activity, [id number]- Neuromuscular re-education, 97535- Self Care, 02859- Manual therapy, G0283- Electrical stimulation (unattended), 97035- Ultrasound, Patient/Family education, and Moist heat.  PLAN FOR NEXT SESSION: Advance core exercises.  STW/M and modalities as needed.     Mliss Cummins, PT 12/08/23 6:19 PM

## 2023-12-09 ENCOUNTER — Encounter: Payer: MEDICAID | Admitting: Physical Therapy

## 2023-12-11 ENCOUNTER — Encounter: Payer: MEDICAID | Admitting: *Deleted

## 2024-01-10 ENCOUNTER — Telehealth: Payer: MEDICAID | Admitting: Nurse Practitioner

## 2024-01-10 DIAGNOSIS — B9689 Other specified bacterial agents as the cause of diseases classified elsewhere: Secondary | ICD-10-CM | POA: Diagnosis not present

## 2024-01-10 DIAGNOSIS — J019 Acute sinusitis, unspecified: Secondary | ICD-10-CM

## 2024-01-10 MED ORDER — AMOXICILLIN-POT CLAVULANATE 875-125 MG PO TABS: 1.0000 | ORAL_TABLET | Freq: Two times a day (BID) | ORAL | 0 refills | Status: AC

## 2024-01-10 NOTE — Progress Notes (Signed)
 E-Visit for Sinus Problems  We are sorry that you are not feeling well.  Here is how we plan to help!  KEEP IN MIND this is your 3rd presumed sinus infection within the past 4 months. If you experience sinus symptoms within the next 3 months you will need to be seen in person by your PCP for an actual examination of your sinuses as it is not common to have this many sinus infections that require antibiotics within a 4 month time span.  We have sent an antibiotic today.   Based on what you have shared with me it looks like you have sinusitis.  Sinusitis is inflammation and infection in the sinus cavities of the head.  Based on your presentation I believe you most likely have Acute Bacterial Sinusitis.  This is an infection caused by bacteria and is treated with antibiotics. I have prescribed Augmentin  875mg /125mg  one tablet twice daily with food, for 7 days. You may use an oral decongestant such as Mucinex D or if you have glaucoma or high blood pressure use plain Mucinex. Saline nasal spray help and can safely be used as often as needed for congestion.  If you develop worsening sinus pain, fever or notice severe headache and vision changes, or if symptoms are not better after completion of antibiotic, please schedule an appointment with a health care provider.    Sinus infections are not as easily transmitted as other respiratory infection, however we still recommend that you avoid close contact with loved ones, especially the very young and elderly.  Remember to wash your hands thoroughly throughout the day as this is the number one way to prevent the spread of infection!  Home Care: Only take medications as instructed by your medical team. Complete the entire course of an antibiotic. Do not take these medications with alcohol. A steam or ultrasonic humidifier can help congestion.  You can place a towel over your head and breathe in the steam from hot water  coming from a faucet. Avoid close contacts  especially the very young and the elderly. Cover your mouth when you cough or sneeze. Always remember to wash your hands.  Get Help Right Away If: You develop worsening fever or sinus pain. You develop a severe head ache or visual changes. Your symptoms persist after you have completed your treatment plan.  Make sure you Understand these instructions. Will watch your condition. Will get help right away if you are not doing well or get worse.  Thank you for choosing an e-visit.  Your e-visit answers were reviewed by a board certified advanced clinical practitioner to complete your personal care plan. Depending upon the condition, your plan could have included both over the counter or prescription medications.  Please review your pharmacy choice. Make sure the pharmacy is open so you can pick up prescription now. If there is a problem, you may contact your provider through [employer] and have the prescription routed to another pharmacy.  Your safety is important to us . If you have drug allergies check your prescription carefully.   For the next 24 hours you can use MyChart to ask questions about today's visit, request a non-urgent call back, or ask for a work or school excuse. You will get an email in the next two days asking about your experience. I hope that your e-visit has been valuable and will speed your recovery.

## 2024-01-10 NOTE — Progress Notes (Signed)
 I have spent 5 minutes in review of e-visit questionnaire, review and updating patient chart, medical decision making and response to patient.   Claiborne Rigg, NP

## 2024-01-13 ENCOUNTER — Ambulatory Visit: Payer: MEDICAID | Attending: Internal Medicine

## 2024-01-13 DIAGNOSIS — M5459 Other low back pain: Secondary | ICD-10-CM | POA: Insufficient documentation

## 2024-01-13 DIAGNOSIS — M62830 Muscle spasm of back: Secondary | ICD-10-CM | POA: Insufficient documentation

## 2024-01-13 NOTE — Therapy (Addendum)
 OUTPATIENT PHYSICAL THERAPY THORACOLUMBAR EVALUATION   Patient Name: Sheila Merritt MRN: [id number] DOB:May 22, 1990, 33 y.o., female Today's Date: 01/13/2024  END OF SESSION:  PT End of Session - 01/13/24 1350     Visit Number 2    Number of Visits 8    Date for PT Re-Evaluation 01/15/24    Authorization - Number of Visits 6   06/06/24   PT Start Time 1349    PT Stop Time 1430    PT Time Calculation (min) 41 min    Activity Tolerance Patient tolerated treatment well    Behavior During Therapy [facility] for tasks assessed/performed          Past Medical History:  Diagnosis Date   Abnormal Pap smear    Anemia    BV (bacterial vaginosis)    Hepatitis C    History of abnormal cervical Pap smear 12/08/2014   Irritable bowel syndrome    UTI (lower urinary tract infection)    Vaginal Pap smear, abnormal    Past Surgical History:  Procedure Laterality Date   CERVICAL CONIZATION W/BX N/A 01/27/2015   Procedure: LASER CONIZATION OF CERVIX WITH BIOPSY;  Surgeon: Vonn VEAR Inch, MD;  Location: [facility];  Service: Gynecology;  Laterality: N/A;   HOLMIUM LASER APPLICATION N/A 01/27/2015   Procedure: HOLMIUM LASER APPLICATION;  Surgeon: Vonn VEAR Inch, MD;  Location: [facility];  Service: Gynecology;  Laterality: N/A;   NO PAST SURGERIES     Patient Active Problem List   Diagnosis Date Noted   History of hepatitis C 08/26/2021   History of parasitic infection 08/26/2021   Anorexia 08/26/2021   Abdominal cramping 08/26/2021   Bloating 08/26/2021   Lower abdominal pain 08/26/2021   Chronic idiopathic constipation 08/26/2021   Irritable bowel syndrome with constipation 08/26/2021   Dark stools 08/26/2021   Hematochezia 08/26/2021   History of substance abuse (HCC) 07/06/2021   Iron deficiency anemia secondary to inadequate dietary iron intake 07/06/2021   Recurrent depression (HCC) 07/06/2021   Chronic hepatitis C without hepatic coma (HCC) 06/14/2020   Anxiety 09/29/2019   History of  abnormal cervical Pap smear 12/08/2014    REFERRING PROVIDER: Renato Record  REFERRING DIAG: Lumbar arthropathy.  Spondylopathy.  Rationale for Evaluation and Treatment: Rehabilitation  THERAPY DIAG:  Other low back pain  Muscle spasm of back  ONSET DATE: 02/23/24 (surgery date).  SUBJECTIVE:                                                                                                                                                                                           SUBJECTIVE STATEMENT: Pt reports 2/10  low back pain today.   PERTINENT HISTORY:  Lumbar surgery.  Calcaneal fracture.    PAIN:  Are you having pain? Yes: NPRS scale: 2/10. Pain location: Low back. Pain description: As above. Aggravating factors: As above. Relieving factors: As above.    PRECAUTIONS: Other: Lumbar fusion.    RED FLAGS: None   WEIGHT BEARING RESTRICTIONS: No  FALLS:  Has patient fallen in last 6 months? No   PLOF: Independent  PATIENT GOALS: Increase function and do more with minimal pain.      OBJECTIVE:   POSTURE: No Significant postural limitations  PALPATION: Minimal palpable lumbar pain but a notable increase in tone on right.    LUMBAR ROM:   Full flexion and extension to 10 degrees.  LOWER EXTREMITY ROM:     WNL.  LOWER EXTREMITY MMT:    Normal LE strength.  GAIT: WNL.  TREATMENT DATE:    01/13/24                            EXERCISE LOG  Exercise Repetitions and Resistance Comments  Nustep  Lvl 3 x 15 mins   XTS Rows Blue 2 sets of 25 reps   XTS Pulldowns Blue 2 sets of 25 reps    Rockerboard 4 mins   Forward Step Ups    Side-stepping Airex x 4 mins   Tandem Gait Airex x 4 mins   Plantarflexion Blue 2 sets of 25 reps    Gastroc Stretch 5 reps x 30 sec    Blank cell = exercise not performed today   12/04/23 Prone over pillow for comfort:  STW/M x 9 minutes to patient's lumbar musculature, right > left.                                                                                                                                  PATIENT EDUCATION:  Education details:  Person educated:  International aid/development worker:  Education comprehension:   HOME EXERCISE PROGRAM:   ASSESSMENT:  CLINICAL IMPRESSION: Pt arrives for today's treatment session reporting 2/10 low back pain.   Pt states that she has been doing pilates at home and is doing well.  Pt tolerated Nustep today for warm up without issue.  Pt instructed in standing and seated exercises today to increase strength, function, and balance.  Pt requiring min cues for proper technique and posture with all newly added exercises.  Pt given blue t-band for home use with pt declining printed copy of HEP.  Pt denied any pain at completion of today's treatment session.  OBJECTIVE IMPAIRMENTS: decreased activity tolerance, increased muscle spasms, and pain.   ACTIVITY LIMITATIONS: carrying, lifting, and bending  PARTICIPATION LIMITATIONS: yard work  PERSONAL FACTORS: Time since onset of injury/illness/exacerbation and 1 comorbidity: lumbar surgery are also affecting patient's functional outcome.   REHAB POTENTIAL: Excellent  CLINICAL DECISION MAKING: Stable/uncomplicated  EVALUATION COMPLEXITY:  Low   GOALS:  LONG TERM GOALS: Target date: 01/01/24.  Ind with an advanced HEP.  Goal status: INITIAL  2.  Walk a community distance with pain not > 2-3/10.  Goal status: INITIAL  3.  Perform ADL's with pain not > 2-3/10.  Goal status: INITIAL  PLAN:  PT FREQUENCY: 2x/week  PT DURATION: 6 weeks  PLANNED INTERVENTIONS: 97110-Therapeutic exercises, 97530- Therapeutic activity, [id number]- Neuromuscular re-education, 97535- Self Care, 02859- Manual therapy, [id number]- Electrical stimulation (unattended), 97035- Ultrasound, Patient/Family education, and Moist heat.  PLAN FOR NEXT SESSION: Advance core exercises.  STW/M and modalities as needed.     Delon DELENA Gosling, PTA 01/13/2024, 2:46 PM

## 2024-01-19 ENCOUNTER — Ambulatory Visit: Payer: MEDICAID

## 2024-01-19 DIAGNOSIS — M5459 Other low back pain: Secondary | ICD-10-CM

## 2024-01-19 DIAGNOSIS — M62830 Muscle spasm of back: Secondary | ICD-10-CM

## 2024-01-19 NOTE — Therapy (Signed)
 OUTPATIENT PHYSICAL THERAPY THORACOLUMBAR EVALUATION   Patient Name: Sheila Merritt MRN: [id number] DOB:[birth year], 33 y.o., female Today's Date: 01/19/2024  END OF SESSION:  PT End of Session - 01/19/24 1347     Visit Number 3    Number of Visits 8    Date for Recertification  01/15/24    Authorization - Number of Visits 6   06/06/24   PT Start Time 1345    PT Stop Time 1430    PT Time Calculation (min) 45 min    Activity Tolerance Patient tolerated treatment well    Behavior During Therapy [facility] for tasks assessed/performed          Past Medical History:  Diagnosis Date   Abnormal Pap smear    Anemia    BV (bacterial vaginosis)    Hepatitis C    History of abnormal cervical Pap smear 12/08/2014   Irritable bowel syndrome    UTI (lower urinary tract infection)    Vaginal Pap smear, abnormal    Past Surgical History:  Procedure Laterality Date   CERVICAL CONIZATION W/BX N/A 01/27/2015   Procedure: LASER CONIZATION OF CERVIX WITH BIOPSY;  Surgeon: Vonn VEAR Inch, MD;  Location: [facility];  Service: Gynecology;  Laterality: N/A;   HOLMIUM LASER APPLICATION N/A 01/27/2015   Procedure: HOLMIUM LASER APPLICATION;  Surgeon: Vonn VEAR Inch, MD;  Location: [facility];  Service: Gynecology;  Laterality: N/A;   NO PAST SURGERIES     Patient Active Problem List   Diagnosis Date Noted   History of hepatitis C 08/26/2021   History of parasitic infection 08/26/2021   Anorexia 08/26/2021   Abdominal cramping 08/26/2021   Bloating 08/26/2021   Lower abdominal pain 08/26/2021   Chronic idiopathic constipation 08/26/2021   Irritable bowel syndrome with constipation 08/26/2021   Dark stools 08/26/2021   Hematochezia 08/26/2021   History of substance abuse (HCC) 07/06/2021   Iron deficiency anemia secondary to inadequate dietary iron intake 07/06/2021   Recurrent depression (HCC) 07/06/2021   Chronic hepatitis C without hepatic coma (HCC) 06/14/2020   Anxiety 09/29/2019   History of  abnormal cervical Pap smear 12/08/2014    REFERRING PROVIDER: Renato Record  REFERRING DIAG: Lumbar arthropathy.  Spondylopathy.  Rationale for Evaluation and Treatment: Rehabilitation  THERAPY DIAG:  Other low back pain  Muscle spasm of back  ONSET DATE: 02/23/24 (surgery date).  SUBJECTIVE:                                                                                                                                                                                           SUBJECTIVE STATEMENT: Pt denies any  pain today.  Reports feeling really good after last treatment session.   PERTINENT HISTORY:  Lumbar surgery.  Calcaneal fracture.    PAIN:  Are you having pain? No  PRECAUTIONS: Other: Lumbar fusion.    RED FLAGS: None   WEIGHT BEARING RESTRICTIONS: No  FALLS:  Has patient fallen in last 6 months? No   PLOF: Independent  PATIENT GOALS: Increase function and do more with minimal pain.    OBJECTIVE:   POSTURE: No Significant postural limitations  PALPATION: Minimal palpable lumbar pain but a notable increase in tone on right.    LUMBAR ROM:   Full flexion and extension to 10 degrees.  LOWER EXTREMITY ROM:     WNL.  LOWER EXTREMITY MMT:    Normal LE strength.  GAIT: WNL.  TREATMENT DATE:   01/19/24                            EXERCISE LOG  Exercise Repetitions and Resistance Comments  Nustep  Lvl 3 x 15 mins   Cybex Knee Flexion 30# x 3.5 mins   Cybex Knee Extension 10# x 3.5 mins   XTS Rows Blue 2 sets of 30 reps   XTS Pulldowns Blue 2 sets of 30 reps    Rockerboard 5 mins   Forward Step Ups    Side-stepping Airex x 5 mins   Tandem Gait Airex x 5 mins   Plantarflexion    Gastroc Stretch     Blank cell = exercise not performed today   12/04/23 Prone over pillow for comfort:  STW/M x 9 minutes to patient's lumbar musculature, right > left.                                                                                                                                  PATIENT EDUCATION:  Education details:  Person educated:  International aid/development worker:  Education comprehension:   HOME EXERCISE PROGRAM:   ASSESSMENT:  CLINICAL IMPRESSION: Pt arrives for today's treatment session denying any pain.  Pt stated she felt really good after last treatment session.  Pt introduced to cybex knee flexion and extensions today with min cues required for eccentric control.  Pt able to tolerate increased reps and time with all previously performed exercises.  Pt denied any pain at completion of today's treatment session.   OBJECTIVE IMPAIRMENTS: decreased activity tolerance, increased muscle spasms, and pain.   ACTIVITY LIMITATIONS: carrying, lifting, and bending  PARTICIPATION LIMITATIONS: yard work  PERSONAL FACTORS: Time since onset of injury/illness/exacerbation and 1 comorbidity: lumbar surgery are also affecting patient's functional outcome.   REHAB POTENTIAL: Excellent  CLINICAL DECISION MAKING: Stable/uncomplicated  EVALUATION COMPLEXITY: Low   GOALS:  LONG TERM GOALS: Target date: 01/01/24.  Ind with an advanced HEP.  Goal status: INITIAL  2.  Walk a community distance with pain not > 2-3/10.  Goal status: INITIAL  3.  Perform ADL's with pain not > 2-3/10.  Goal status: INITIAL  PLAN:  PT FREQUENCY: 2x/week  PT DURATION: 6 weeks  PLANNED INTERVENTIONS: 97110-Therapeutic exercises, 97530- Therapeutic activity, [id number]- Neuromuscular re-education, 97535- Self Care, 02859- Manual therapy, G0283- Electrical stimulation (unattended), 97035- Ultrasound, Patient/Family education, and Moist heat.  PLAN FOR NEXT SESSION: Advance core exercises.  STW/M and modalities as needed.     Delon DELENA Gosling, PTA 01/19/2024, 2:34 PM

## 2024-01-24 ENCOUNTER — Telehealth: Payer: MEDICAID

## 2024-01-28 ENCOUNTER — Encounter: Payer: Self-pay | Admitting: Physical Therapy

## 2024-01-28 ENCOUNTER — Ambulatory Visit: Payer: MEDICAID | Attending: Internal Medicine | Admitting: Physical Therapy

## 2024-01-28 DIAGNOSIS — M62830 Muscle spasm of back: Secondary | ICD-10-CM | POA: Diagnosis present

## 2024-01-28 DIAGNOSIS — M5459 Other low back pain: Secondary | ICD-10-CM | POA: Insufficient documentation

## 2024-01-28 NOTE — Therapy (Signed)
 OUTPATIENT PHYSICAL THERAPY THORACOLUMBAR TREATMENT   Patient Name: Sheila Merritt MRN: [id number] DOB:Sep 01, 1990, 33 y.o., female Today's Date: 01/28/2024  END OF SESSION:  PT End of Session - 01/28/24 1448     Visit Number 4    Number of Visits 8    Date for Recertification  01/15/24    PT Start Time 0230    PT Stop Time 0329    PT Time Calculation (min) 59 min    Activity Tolerance Patient tolerated treatment well    Behavior During Therapy [facility] for tasks assessed/performed           Past Medical History:  Diagnosis Date   Abnormal Pap smear    Anemia    BV (bacterial vaginosis)    Hepatitis C    History of abnormal cervical Pap smear 12/08/2014   Irritable bowel syndrome    UTI (lower urinary tract infection)    Vaginal Pap smear, abnormal    Past Surgical History:  Procedure Laterality Date   CERVICAL CONIZATION W/BX N/A 01/27/2015   Procedure: LASER CONIZATION OF CERVIX WITH BIOPSY;  Surgeon: Vonn VEAR Inch, MD;  Location: [facility];  Service: Gynecology;  Laterality: N/A;   HOLMIUM LASER APPLICATION N/A 01/27/2015   Procedure: HOLMIUM LASER APPLICATION;  Surgeon: Vonn VEAR Inch, MD;  Location: [facility];  Service: Gynecology;  Laterality: N/A;   NO PAST SURGERIES     Patient Active Problem List   Diagnosis Date Noted   History of hepatitis C 08/26/2021   History of parasitic infection 08/26/2021   Anorexia 08/26/2021   Abdominal cramping 08/26/2021   Bloating 08/26/2021   Lower abdominal pain 08/26/2021   Chronic idiopathic constipation 08/26/2021   Irritable bowel syndrome with constipation 08/26/2021   Dark stools 08/26/2021   Hematochezia 08/26/2021   History of substance abuse (HCC) 07/06/2021   Iron deficiency anemia secondary to inadequate dietary iron intake 07/06/2021   Recurrent depression 07/06/2021   Chronic hepatitis C without hepatic coma (HCC) 06/14/2020   Anxiety 09/29/2019   History of abnormal cervical Pap smear 12/08/2014    REFERRING  PROVIDER: Renato Record  REFERRING DIAG: Lumbar arthropathy.  Spondylopathy.  Rationale for Evaluation and Treatment: Rehabilitation  THERAPY DIAG:  Muscle spasm of back  Other low back pain  ONSET DATE: 02/23/24 (surgery date).  SUBJECTIVE:                                                                                                                                                                                           SUBJECTIVE STATEMENT: Pain around a 3 today.  Right side of back feel tight.  PERTINENT HISTORY:  Lumbar surgery.  Calcaneal fracture.    PAIN:  Are you having pain? No  PRECAUTIONS: Other: Lumbar fusion.    RED FLAGS: None   WEIGHT BEARING RESTRICTIONS: No  FALLS:  Has patient fallen in last 6 months? No   PLOF: Independent  PATIENT GOALS: Increase function and do more with minimal pain.    OBJECTIVE:   POSTURE: No Significant postural limitations  PALPATION: Minimal palpable lumbar pain but a notable increase in tone on right.    LUMBAR ROM:   Full flexion and extension to 10 degrees.  LOWER EXTREMITY ROM:     WNL.  LOWER EXTREMITY MMT:    Normal LE strength.  GAIT: WNL.  TREATMENT DATE:   01/28/24:                                     EXERCISE LOG  Exercise Repetitions and Resistance Comments  Nustep Level 3 x 16 minutes   Back ext machine 50# with draw-in core activation x 3 minutes   Ab curls 50# with draw-in core activation x 3 minutes   Knee ext 10# x 3 minutes   Ham curls 30# x 3 minutes         In prone over pillow:  STW/M x 10 minutes with ischemic release technique utilized, right > left f/b HMP x 15 minutes in supine with leg elevator for comfort.   01/19/24                            EXERCISE LOG  Exercise Repetitions and Resistance Comments  Nustep  Lvl 3 x 15 mins   Cybex Knee Flexion 30# x 3.5 mins   Cybex Knee Extension 10# x 3.5 mins   XTS Rows Blue 2 sets of 30 reps   XTS Pulldowns Blue 2 sets  of 30 reps    Rockerboard 5 mins   Forward Step Ups    Side-stepping Airex x 5 mins   Tandem Gait Airex x 5 mins   Plantarflexion    Gastroc Stretch     Blank cell = exercise not performed today   12/04/23 Prone over pillow for comfort:  STW/M x 9 minutes to patient's lumbar musculature, right > left.                                                                                                                                 PATIENT EDUCATION:  Education details:  Person educated:  International aid/development worker:  Education comprehension:   HOME EXERCISE PROGRAM:   ASSESSMENT:  CLINICAL IMPRESSION: Patient is very motivated and did well with the addition of back ext and ab curl machine performed with excellent technique and without complaint.  Her right lumbar musculature exhibited significant tone especially her QL.  She responded well  to STW/M today.  OBJECTIVE IMPAIRMENTS: decreased activity tolerance, increased muscle spasms, and pain.   ACTIVITY LIMITATIONS: carrying, lifting, and bending  PARTICIPATION LIMITATIONS: yard work  PERSONAL FACTORS: Time since onset of injury/illness/exacerbation and 1 comorbidity: lumbar surgery are also affecting patient's functional outcome.   REHAB POTENTIAL: Excellent  CLINICAL DECISION MAKING: Stable/uncomplicated  EVALUATION COMPLEXITY: Low   GOALS:  LONG TERM GOALS: Target date: 01/01/24.  Ind with an advanced HEP.  Goal status: INITIAL  2.  Walk a community distance with pain not > 2-3/10.  Goal status: INITIAL  3.  Perform ADL's with pain not > 2-3/10.  Goal status: INITIAL  PLAN:  PT FREQUENCY: 2x/week  PT DURATION: 6 weeks  PLANNED INTERVENTIONS: 97110-Therapeutic exercises, 97530- Therapeutic activity, [id number]- Neuromuscular re-education, 97535- Self Care, 02859- Manual therapy, [id number]- Electrical stimulation (unattended), 97035- Ultrasound, Patient/Family education, and Moist heat.  PLAN FOR NEXT SESSION: Advance core  exercises.  STW/M and modalities as needed.     Everado Pillsbury, [place], PT 01/28/2024, 3:37 PM

## 2024-02-04 ENCOUNTER — Encounter: Payer: MEDICAID | Admitting: Physical Therapy

## 2024-02-17 ENCOUNTER — Ambulatory Visit: Payer: MEDICAID
